# Patient Record
Sex: Male | Born: 1960 | Hispanic: Yes | Marital: Married | Smoking: Never smoker
Health system: Southern US, Community
[De-identification: ages and names within clinical notes are randomized; demographics above are authoritative.]

## PROBLEM LIST (undated history)

## (undated) DIAGNOSIS — C801 Malignant (primary) neoplasm, unspecified: Secondary | ICD-10-CM

## (undated) DIAGNOSIS — I639 Cerebral infarction, unspecified: Secondary | ICD-10-CM

## (undated) DIAGNOSIS — M1990 Unspecified osteoarthritis, unspecified site: Secondary | ICD-10-CM

## (undated) DIAGNOSIS — I10 Essential (primary) hypertension: Secondary | ICD-10-CM

## (undated) DIAGNOSIS — G459 Transient cerebral ischemic attack, unspecified: Secondary | ICD-10-CM

## (undated) DIAGNOSIS — E785 Hyperlipidemia, unspecified: Secondary | ICD-10-CM

## (undated) DIAGNOSIS — K219 Gastro-esophageal reflux disease without esophagitis: Secondary | ICD-10-CM

---

## 2004-10-02 ENCOUNTER — Ambulatory Visit: Payer: Self-pay | Admitting: Podiatry

## 2004-11-08 ENCOUNTER — Other Ambulatory Visit: Payer: Self-pay

## 2004-11-09 ENCOUNTER — Emergency Department: Payer: Self-pay | Admitting: Emergency Medicine

## 2005-04-07 ENCOUNTER — Emergency Department: Payer: Self-pay | Admitting: Emergency Medicine

## 2006-02-11 ENCOUNTER — Ambulatory Visit: Payer: Self-pay | Admitting: Vascular Surgery

## 2006-07-10 ENCOUNTER — Encounter: Payer: Self-pay | Admitting: Internal Medicine

## 2006-07-11 ENCOUNTER — Encounter: Payer: Self-pay | Admitting: Internal Medicine

## 2006-07-28 ENCOUNTER — Ambulatory Visit: Payer: Self-pay | Admitting: Internal Medicine

## 2006-08-11 ENCOUNTER — Encounter: Payer: Self-pay | Admitting: Internal Medicine

## 2006-09-10 ENCOUNTER — Encounter: Payer: Self-pay | Admitting: Internal Medicine

## 2010-02-18 ENCOUNTER — Ambulatory Visit: Payer: Self-pay | Admitting: Gastroenterology

## 2011-08-28 ENCOUNTER — Encounter: Payer: Self-pay | Admitting: Family Medicine

## 2011-09-10 ENCOUNTER — Encounter: Payer: Self-pay | Admitting: Family Medicine

## 2012-02-05 ENCOUNTER — Emergency Department: Payer: Self-pay | Admitting: Emergency Medicine

## 2012-02-05 ENCOUNTER — Ambulatory Visit: Payer: Self-pay | Admitting: Family Medicine

## 2012-02-05 LAB — COMPREHENSIVE METABOLIC PANEL
Albumin: 4.5 g/dL (ref 3.4–5.0)
Anion Gap: 9 (ref 7–16)
BUN: 15 mg/dL (ref 7–18)
Bilirubin,Total: 0.8 mg/dL (ref 0.2–1.0)
Calcium, Total: 9.5 mg/dL (ref 8.5–10.1)
Chloride: 106 mmol/L (ref 98–107)
Creatinine: 1.08 mg/dL (ref 0.60–1.30)
EGFR (African American): >60
Glucose: 121 mg/dL — ABNORMAL HIGH (ref 65–99)
Potassium: 3.9 mmol/L (ref 3.5–5.1)
SGOT(AST): 21 U/L (ref 15–37)
Sodium: 141 mmol/L (ref 136–145)
Total Protein: 8.4 g/dL — ABNORMAL HIGH (ref 6.4–8.2)

## 2012-02-05 LAB — CBC
HCT: 45.6 % (ref 40.0–52.0)
HGB: 15 g/dL (ref 13.0–18.0)
MCH: 28.5 pg (ref 26.0–34.0)
MCHC: 32.9 g/dL (ref 32.0–36.0)
Platelet: 262 10*3/uL (ref 150–440)
RDW: 13.8 % (ref 11.5–14.5)

## 2012-02-05 LAB — PROTIME-INR: INR: 1

## 2012-02-05 LAB — APTT: Activated PTT: 27.5 secs (ref 23.6–35.9)

## 2012-02-05 LAB — TROPONIN I: Troponin-I: 0.02 ng/mL

## 2012-07-08 ENCOUNTER — Encounter: Payer: Self-pay | Admitting: Family Medicine

## 2012-07-10 ENCOUNTER — Encounter: Payer: Self-pay | Admitting: Family Medicine

## 2012-12-03 LAB — BASIC METABOLIC PANEL
BUN: 18 mg/dL (ref 7–18)
Calcium, Total: 9.2 mg/dL (ref 8.5–10.1)
Chloride: 103 mmol/L (ref 98–107)
Co2: 23 mmol/L (ref 21–32)
Creatinine: 1.43 mg/dL — ABNORMAL HIGH (ref 0.60–1.30)
EGFR (African American): >60
Glucose: 132 mg/dL — ABNORMAL HIGH (ref 65–99)
Potassium: 3.6 mmol/L (ref 3.5–5.1)
Sodium: 135 mmol/L — ABNORMAL LOW (ref 136–145)

## 2012-12-03 LAB — CBC WITH DIFFERENTIAL/PLATELET
Eosinophil #: 0 10*3/uL (ref 0.0–0.7)
HCT: 41.3 % (ref 40.0–52.0)
HGB: 13.9 g/dL (ref 13.0–18.0)
Lymphocyte #: 1.9 10*3/uL (ref 1.0–3.6)
MCH: 27.9 pg (ref 26.0–34.0)
MCHC: 33.7 g/dL (ref 32.0–36.0)
MCV: 83 fL (ref 80–100)
Monocyte #: 1.9 x10 3/mm — ABNORMAL HIGH (ref 0.2–1.0)
Monocyte %: 15.6 %
Neutrophil #: 8.1 10*3/uL — ABNORMAL HIGH (ref 1.4–6.5)
Platelet: 248 10*3/uL (ref 150–440)
RBC: 4.98 10*6/uL (ref 4.40–5.90)
RDW: 13.6 % (ref 11.5–14.5)
WBC: 12 10*3/uL — ABNORMAL HIGH (ref 3.8–10.6)

## 2012-12-04 ENCOUNTER — Inpatient Hospital Stay: Payer: Self-pay | Admitting: Internal Medicine

## 2012-12-05 LAB — CBC WITH DIFFERENTIAL/PLATELET
Basophil #: 0.1 10*3/uL (ref 0.0–0.1)
Basophil %: 0.7 %
Eosinophil %: 1.4 %
HCT: 37.5 % — ABNORMAL LOW (ref 40.0–52.0)
Lymphocyte %: 18.9 %
MCH: 27.6 pg (ref 26.0–34.0)
MCHC: 33.2 g/dL (ref 32.0–36.0)
MCV: 83 fL (ref 80–100)
Monocyte #: 1.6 x10 3/mm — ABNORMAL HIGH (ref 0.2–1.0)
Neutrophil #: 5.2 10*3/uL (ref 1.4–6.5)
Neutrophil %: 60.4 %
Platelet: 201 10*3/uL (ref 150–440)
RDW: 13.3 % (ref 11.5–14.5)
WBC: 8.6 10*3/uL (ref 3.8–10.6)

## 2012-12-05 LAB — BASIC METABOLIC PANEL WITH GFR
Anion Gap: 7
BUN: 14 mg/dL
Calcium, Total: 8.8 mg/dL
Chloride: 105 mmol/L
Co2: 25 mmol/L
Creatinine: 1.13 mg/dL
EGFR (African American): >60
EGFR (Non-African Amer.): >60
Glucose: 101 mg/dL — ABNORMAL HIGH
Osmolality: 274
Potassium: 3.5 mmol/L
Sodium: 137 mmol/L

## 2012-12-05 LAB — MAGNESIUM: Magnesium: 2 mg/dL

## 2012-12-06 LAB — VANCOMYCIN, TROUGH: Vancomycin, Trough: 6 ug/mL — ABNORMAL LOW (ref 10–20)

## 2012-12-07 LAB — BASIC METABOLIC PANEL
Anion Gap: 7 (ref 7–16)
BUN: 13 mg/dL (ref 7–18)
Chloride: 104 mmol/L (ref 98–107)
Co2: 26 mmol/L (ref 21–32)
Glucose: 117 mg/dL — ABNORMAL HIGH (ref 65–99)
Osmolality: 275 (ref 275–301)
Potassium: 3.4 mmol/L — ABNORMAL LOW (ref 3.5–5.1)

## 2012-12-07 LAB — VANCOMYCIN, TROUGH: Vancomycin, Trough: 12 ug/mL (ref 10–20)

## 2012-12-09 LAB — CULTURE, BLOOD (SINGLE)

## 2014-02-13 ENCOUNTER — Ambulatory Visit: Payer: Self-pay | Admitting: Gastroenterology

## 2014-02-17 LAB — PATHOLOGY REPORT

## 2014-09-01 NOTE — H&P (Signed)
PATIENT NAME:  [name], [name] MR#:  [id number] DATE OF BIRTH:  [birth year]  DATE OF ADMISSION:  12/04/2012  PRIMARY CARE PHYSICIAN:  Dr. Sherilyn Cooter.   REFERRING PHYSICIAN:  Dr. Dahlia Client.    DENTIST:  The patient's dentist is Dr. Renee Pain.    CHIEF COMPLAINT:  Tooth pain and right facial swelling.   HISTORY OF PRESENT ILLNESS:  The patient is a 54 year old male with past medical history of coronary artery disease, stroke, hypertension and hyperlipidemia, has developed a tooth abscess on the right side regarding which he was seen by Dr. Renee Pain (dentist) and has had root canal treatment and drainage of the abscess on last Tuesday.  Eventually patient was sent home with by mouth Zithromax.  The patient's right facial swelling gradually has gotten worse and he was unable to open his mouth.  Also, the patient spiked a temp of 102.5 degrees Fahrenheit and he has called his dentist who has recommended the patient to go to the nearest ER.  CAT scan of the neck with IV contrast was done which has revealed inflammatory changes in the soft tissues lateral to the right mandible without collection.  No peritonsillar abscess was noticed.  Cultures were obtained and the patient has received IV clindamycin.  Hospitalist team is called to admit the patient.  During my examination, the patient is complaining of right-sided tooth pain and difficulty in chewing and swallowing, but the patient is able to swallow.  Denies any shortness of breath or throat closing sensation.  As reported by the ER physician, on oral examination, no fluid collection was noticed or palpable.    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   PAST SURGICAL HISTORY:  Recent root canal treatment on Tuesday by Dr. Renee Pain.   ALLERGIES:  The patient is allergic to ERYTHROMYCIN, CELEBREX AND PENICILLIN.   PSYCHOSOCIAL HISTORY:  Lives at home with wife and daughter.  Denies  smoking, alcohol or illicit drug usage.   FAMILY HISTORY:  Mother had heart problems.   HOME MEDICATIONS:  Vytorin 10/40 once daily, vitamin C 1000 mg once daily, ProAir 2 puff inhalation every six hours as needed, Nitrostat sublingual as needed, multivitamin 1 tablet once daily, metoprolol 25 mg once a day, furosemide 20 mg once daily, fish oil 1000 mg 1 capsule once a day, Plavix 75 mg once daily, allopurinol 100 mg once daily.    REVIEW OF SYSTEMS:   CONSTITUTIONAL:  Complaining of fever and weakness.  Denies any weight loss.  Complaining of tooth pain on the right side.  EYES:  Denies blurry vision, glaucoma.  EARS, NOSE, THROAT:  Denies epistaxis, discharge.  RESPIRATORY:  Denies COPD, wheezing.  CARDIOVASCULAR:  No chest pain, palpitations.  GASTROINTESTINAL:  Denies nausea, vomiting, diarrhea.  GENITOURINARY:  No dysuria, hematuria or hernia.  ENDOCRINE:  No polyuria, nocturia, thyroid problems.  HEMATOLOGIC AND LYMPHATIC:  No anemia, easy bruising, bleeding.  INTEGUMENTARY:  No acne, rash, lesions.  MUSCULOSKELETAL:  No joint pain in the neck, back.  Denies gout.  NEUROLOGIC:  Denies any history of vertigo, ataxia.  Has history of stroke.  PSYCHIATRIC:  Denies, ADD, OCD, bipolar disorder.   PHYSICAL EXAMINATION:  VITAL SIGNS:  Temperature 98.8 after Tylenol, pulse 64, respirations 18, blood pressure 113/75, pulse ox 97%.  GENERAL APPEARANCE:  Not under acute distress.  Moderately built and moderately nourished.  HEENT:  Normocephalic.  Pupils are equally reacting to light and accommodation.  Atraumatic.  Right-sided maxillary swelling with tenderness is present.  The patient is having difficulty in opening his mouth, but no swollen tongue.  Trachea is midline.  NECK:  Supple.  No JVD.  Range of motion is limited in view of right maxillary tenderness and no discharge noticed.  LUNGS:  Clear to auscultation bilaterally.  No accessory muscle usage.  No anterior chest wall tenderness on  palpation.  CARDIAC:  S1, S2 normal.  Regular rate and rhythm.  No murmurs.  GASTROINTESTINAL:  Soft.  Bowel sounds are positive in all four quadrants.  Nontender, nondistended.  No masses felt.  No hepatosplenomegaly.  NEUROLOGIC:  Awake, alert, oriented x 3.  Motor and sensory are grossly intact.  Reflexes are 2+.  EXTREMITIES:  No edema.  No cyanosis.  No clubbing.  SKIN:  Warm to touch.  Normal turgor.  No rashes.  MUSCULOSKELETAL:  No joint effusion, tenderness or erythema.   LABORATORY AND IMAGING STUDIES:  Glucose 132, BUN 18, creatinine 1.43, sodium 135, potassium 3.6, chloride 103, CO2 23, anion gap is 9, GFR 60, serum osmolality 274, calcium 9.2.  WBC 12.4, hemoglobin 13.9, hematocrit 41.3, platelets 248.  CT of the neck with intravenous contrast has revealed no peritonsillar abscess.  No significant tonsillar enlargement.  Inflammatory changes in the soft tissues lateral to the right mandible without collection.  Remainder of the findings which revealed no lymphadenopathy.  Hypopharynx and larynx are unremarkable.  Normal epiglottis.   ASSESSMENT AND PLAN:  A 54 year old male presenting to the ER with a chief complaint of right facial swelling after he had a root canal treatment for draining of tooth abscess on Tuesday, will be admitted with the following assessment and plan.  1.  Sepsis from right tooth infection with right mandibular soft tissue swelling.  Blood cultures were ordered.  The patient will be given IV clindamycin and vancomycin.  The patient will be started on probiotic as well.  2.  We will provide him puree diet as he is having difficulty in swallowing.  If the patient could tolerate diet, we will resume all his home medications.  3.  History of stroke and coronary artery disease.  Resume his home medications Plavix and Xarelto.  4.  Hypertension.  Blood pressure is stable at this time.  5.  Acute renal insufficiency.  We will provide him gentle hydration with IV fluids and  check a.m. labs.  6.  We will provide him GI prophylaxis.  7.  DVT prophylaxis is not needed as the patient is on Xarelto.  8.  He is FULL CODE.  Wife is medical power of attorney.   Total time spent on the admission is 45 minutes.    The diagnosis and plan of care was discussed with him.  He is aware of the plan.    ____________________________ Nicholes Mango, MD ag:ea D: 12/04/2012 03:03:08 ET T: 12/04/2012 03:33:54 ET JOB#: [id number]  cc: Nicholes Mango, MD, <Dictator> Nicholes Mango MD ELECTRONICALLY SIGNED 12/06/2012 7:21

## 2014-09-01 NOTE — Discharge Summary (Signed)
PATIENT NAME:  [name], [name] MR#:  [id number] DATE OF BIRTH:  1961-02-27  DATE OF ADMISSION:  12/04/2012 DATE OF DISCHARGE:  12/08/2012  ADMITTING PHYSICIAN: Dr. Margaretmary Eddy.    DISCHARGING PHYSICIAN: Gladstone Lighter, M.D.   CONSULTATIONS IN THE HOSPITAL: None.   PRIMARY CARE PHYSICIAN: Nonlocal.   DISCHARGE DIAGNOSES: 1.  Sepsis.  2.  Right perimandibular cellulitis post root canal treatment.  3.  Acute renal failure on admission, which resolved.  4.  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  5.  History of deep vein thrombosis.  6.  Coronary artery disease.  7.  History of cerebrovascular accident.    DISCHARGE HOME MEDICATIONS:  1.  Magnesium 1 tablet p.o. daily.  2.  Ranitidine 150 mg p.o. b.i.d.  3.  Atorvastatin 80 mg p.o. daily.  4.  Xarelto 20 mg p.o. daily.  5.  Colchicine 0.6 mg p.o. b.i.d. for 3 more days and then take p.o. daily.  6.  Clindamycin 300 mg p.o. q. 8 hours for 5 more days.  7.  Prednisone taper, starting at 40 mg, taper off by 10 mg daily.   DISCHARGE DIET: Low-sodium diet.   DISCHARGE ACTIVITY: As tolerated.    FOLLOWUP INSTRUCTIONS:  1.  PCP follow-up in two weeks.  2.  Follow up with dentist in 1 week.  LABS AND IMAGING STUDIES PRIOR TO DISCHARGE: Sodium 137, potassium 3.4, chloride 104, bicarbonate 26, BUN 13, creatinine 1.1, glucose 117, calcium of 8.9, uric acid 6.6. WBC 11.7 after steroid shot was given.  CT of the maxillofacial area showing inflammatory changes in right maxillary, mandibular region without identifiable loculated fluid collection or  drainable abscess. There is significant stranding in the subcutaneous fatty tissues and muscular thickening secondary to edema, infectious or inflammatory process. His hemoglobin is 12.4, hematocrit 37.5,  platelet count is 201. Blood cultures remained negative while in the hospital. CT of the neck with contrast on admission showing inflammatory changes within the soft tissues  lateral to the mandible on the right side without evidence of fluid collection.   BRIEF HOSPITAL COURSE: [name] is a 54 year old male with past medical history significant for history of coronary artery disease, history of CVA, hypertension, lipidemia, presented to the hospital secondary to high fever, elevated white count with right facial swelling and also tooth pain.   1.  Sepsis secondary to right perimandibular cellulitis. The patient had a root canal treatment  done by his dentist and drainage of the abscess a week prior to presentation. Post procedure the patient has developed fevers and significant swelling and presented to the ER.  A CT of the neck did not show any abscess and blood cultures were negative. The patient was started on vancomycin and Zosyn and was changed over to clindamycin. The patient has significant swelling of his jaw for several days so repeat CT of the maxillofacial area was done. It did not reveal any abscess. The patient does not have any breakdown or open ulcers  when seen from inside of the mouth. It was all soft tissue swelling lateral to his face.  It has significantly improved and now the patient is able to move his jaw and eat regular food. Initially on admission it seems he was only on a liquid diet because of that.  The patient will follow up with his dentist and will continue to take his antibiotic at this time. The patient has remained fever free and the white count has improved significantly  prior to discharge.   2.  Acute renal failure, likely secondary to sepsis and ATN on admission. Improved with IV fluid and  it resolved at the time of discharge.    3.  Acute gouty arthritis of the elbow and also the hand on the right side. The patient has had significant history of gouty attack involving multiple joints and was started on colchicine in the hospital and small prednisone taper at the time of discharge.   4.  History of DVT and CVA.  The patient is on  Xarelto as an outpatient, which were continued. He is also on a statin.     The patient's course has been otherwise uneventful in the hospital.   DISCHARGE CONDITION: Stable.   DISCHARGE DISPOSITION: Home.   TIME SPENT ON DISCHARGE: 40 minutes.   ____________________________ Gladstone Lighter, MD rk:dp D: 12/08/2012 14:04:10 ET T: 12/08/2012 15:11:40 ET JOB#: [id number]  cc: Gladstone Lighter, MD, <Dictator> Gladstone Lighter MD ELECTRONICALLY SIGNED 12/13/2012 15:03

## 2015-02-06 ENCOUNTER — Other Ambulatory Visit
Admission: RE | Admit: 2015-02-06 | Discharge: 2015-02-06 | Disposition: A | Discharge status: Home / Self Care | Payer: Self-pay | Source: Other Acute Inpatient Hospital | Attending: Rheumatology | Admitting: Rheumatology

## 2015-02-06 DIAGNOSIS — M25561 Pain in right knee: Secondary | ICD-10-CM | POA: Insufficient documentation

## 2015-02-06 LAB — SYNOVIAL CELL COUNT + DIFF, W/ CRYSTALS
EOSINOPHILS-SYNOVIAL: 0 %
LYMPHOCYTES-SYNOVIAL FLD: 72 %
Monocyte-Macrophage-Synovial Fluid: 27 %
Neutrophil, Synovial: 1 %
Other Cells-SYN: 0
WBC, Synovial: 149 /mm3 (ref 0–200)

## 2015-04-30 IMAGING — CT CT MAXILLOFACIAL WITH CONTRAST
1 series · 15 of 30 positions shown, 19 images · non-contrast
Comparison: none

REASON FOR EXAM: root canal and subsequent infxn. eval for evolving
abcess as still with sig swel
COMMENTS:

[Series 4: soft tissue · axial · 0.34mm/px · z∈[-237,-60]mm · 15 of 65 slices shown, 19 images]
[im 3/65  brain]
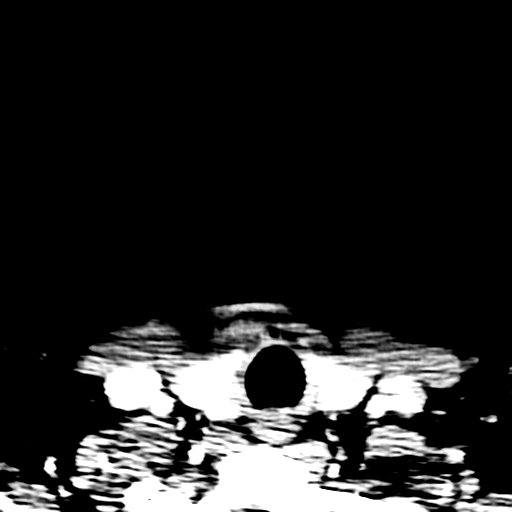
[im 3/65  bone]
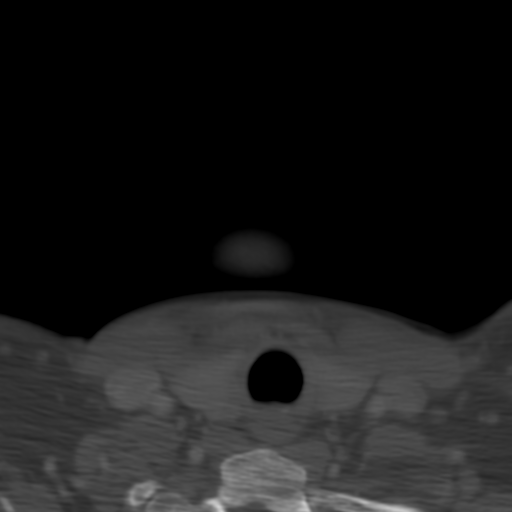
[im 7/65  bone]
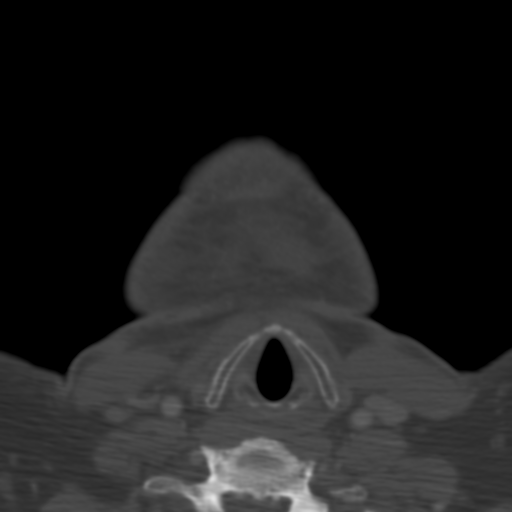
[im 12/65  bone]
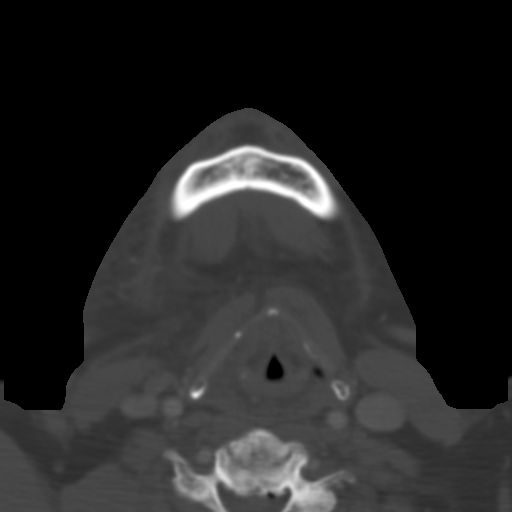
[im 16/65  bone]
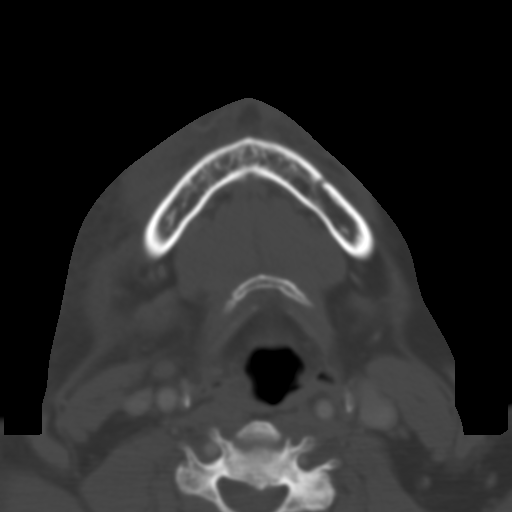
[im 20/65  brain]
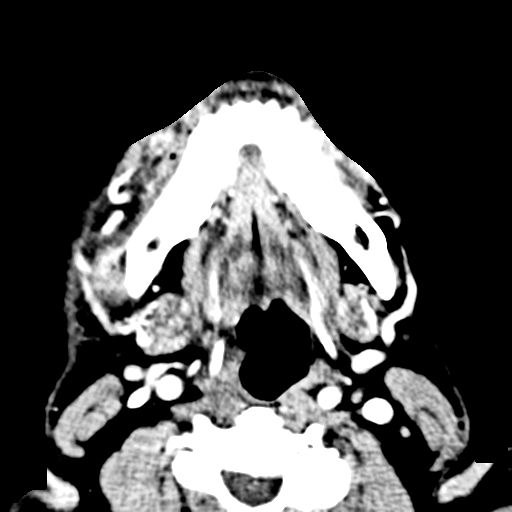
[im 20/65  bone]
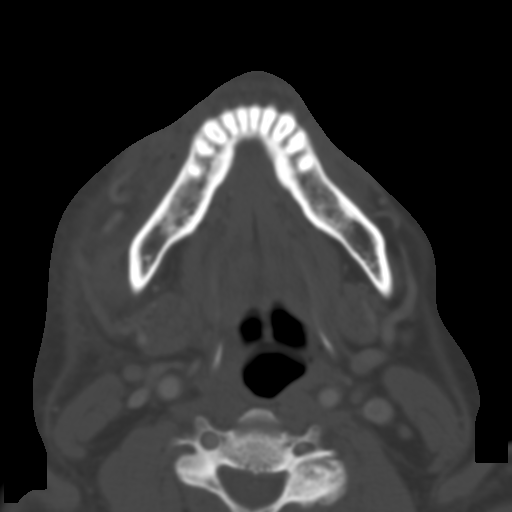
[im 25/65  bone]
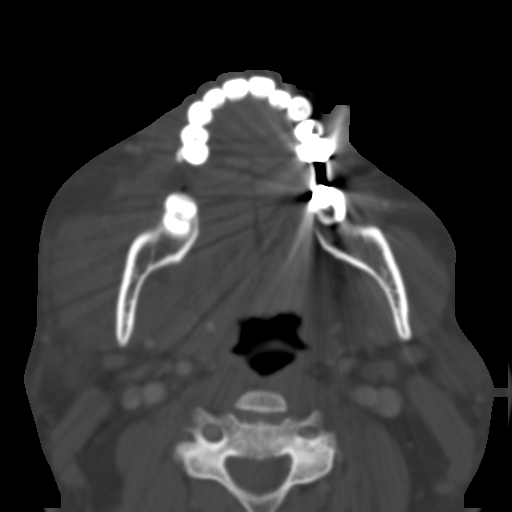
[im 29/65  bone]
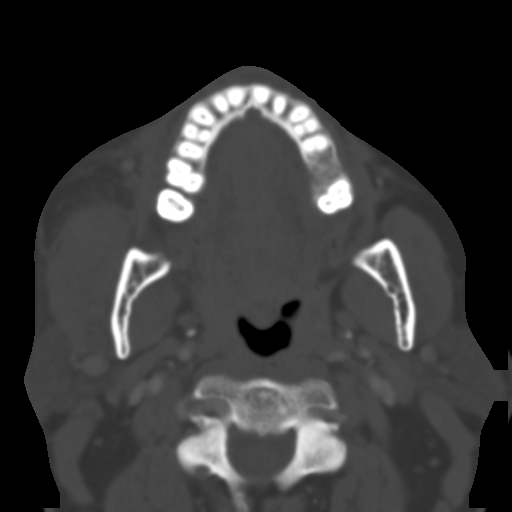
[im 34/65  bone]
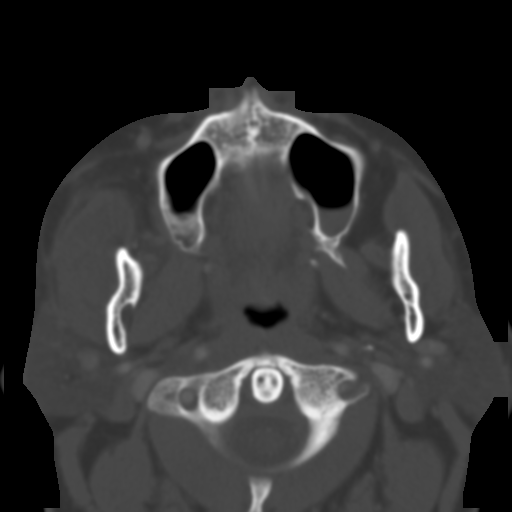
[im 36/65  brain]
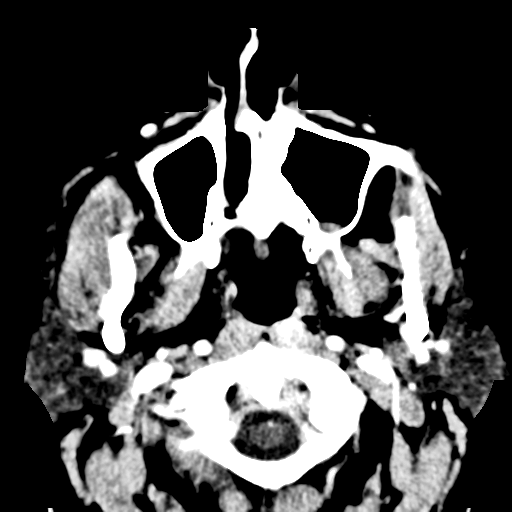
[im 36/65  bone]
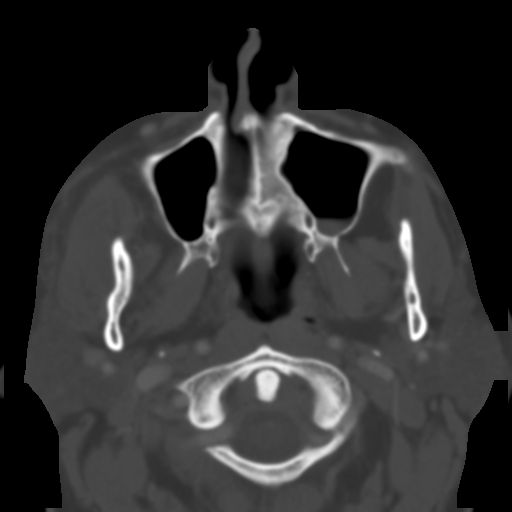
[im 40/65  bone]
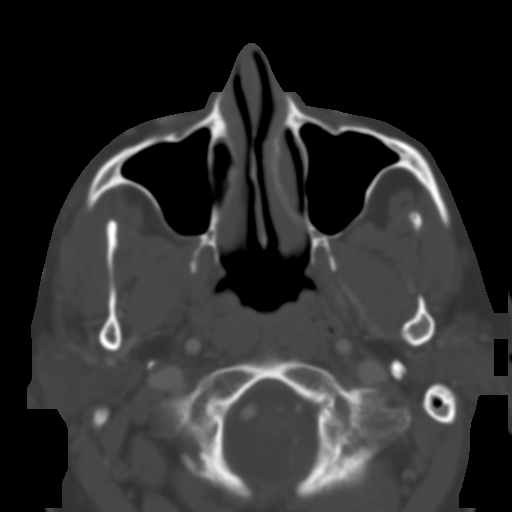
[im 45/65  bone]
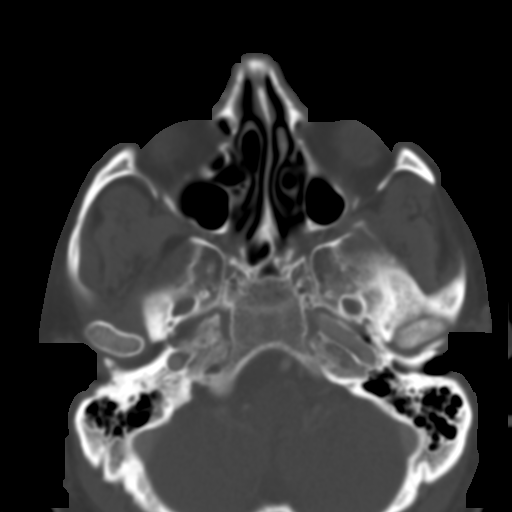
[im 49/65  bone]
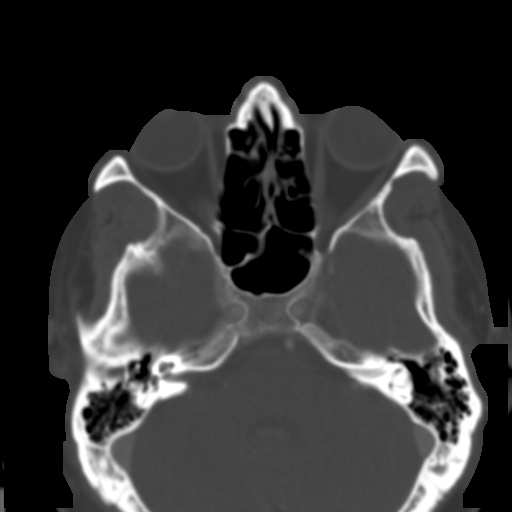
[im 53/65  brain]
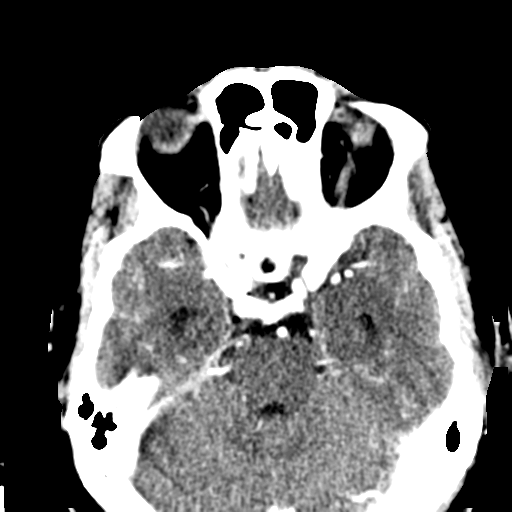
[im 53/65  bone]
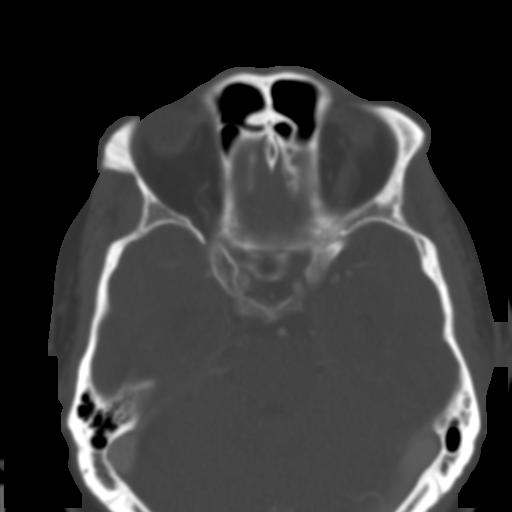
[im 58/65  bone]
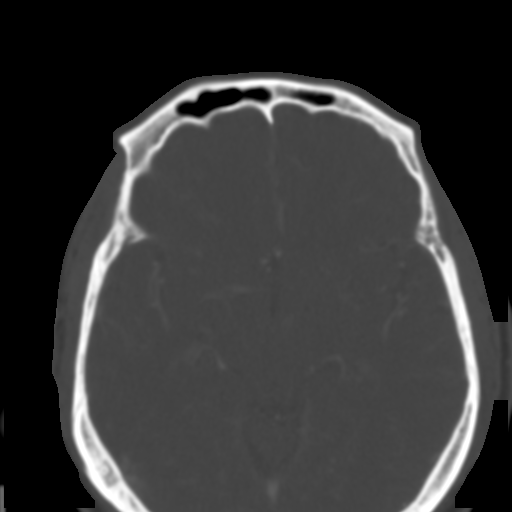
[im 62/65  bone]
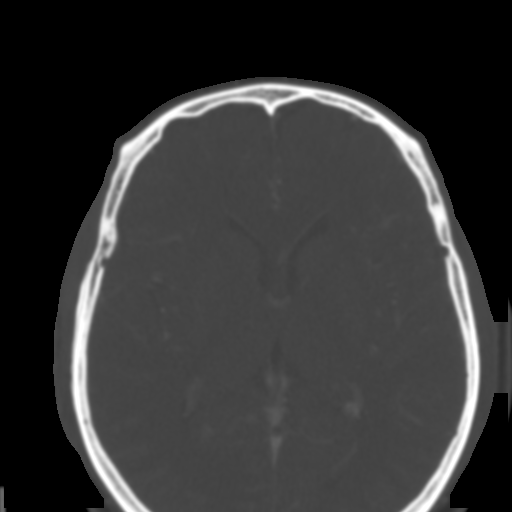

[15 of 30 positions shown; findings below may reference images not displayed]

PROCEDURE:     CT  - CT MAXILLOFACIAL AREA W  - December 06, 2012  [DATE]

RESULT:     CT of the maxillofacial region is performed with 75 mL of
1sovue-TBC iodinated intravenous contrast with images reconstructed at soft
tissue window settings at 3 mm slice thickness in axial and coronal planes
with bone window levels also reconstructed in the same axial and coronal
plane format. Study demonstrates a small air-fluid level density in the
floor of the left maxillary sinus posteriorly. There is significant soft
tissue swelling and inflammatory stranding about the right maxillary and
right mandibular region without a definable or drainable fluid collection.
There is artifact from the patient's dental work 3 some reactive prominent
lymph nodes are seen without pathologic adenopathy appreciated. Salivary
glands appear to be unremarkable bilaterally. There some mild thickening
along the platysma on the right and along the masseter muscle on the right
which could be reactive edematous, infectious or inflammatory changes. The
included portions of the thyroid gland appear to enhance normally. The
nasopharynx and oropharynx appear unremarkable. The orbits and remaining
sinuses appear to be unremarkable. Included portion of the brain enhances
normally.
IMPRESSION: Significant inflammatory changes as described dominantly in
the right maxillary and mandibular region without a identifiable loculated
fluid collection or drainable abscess. This does not mean that continued or
progressive infectious process is absent. There is significant stranding in
the subcutaneous fatty tissues and some muscular thickening as described.
This could be secondary to edema, infectious or inflammatory process. The
incidental note is made of a focal hyperdensity in the lytic regions on
image 19 and 23 which could represent foreign bodies or soft tissue
calcifications. Correlate with history.

[REDACTED]

## 2018-11-09 ENCOUNTER — Other Ambulatory Visit: Payer: Self-pay

## 2018-11-09 ENCOUNTER — Encounter: Payer: Managed Care, Other (non HMO) | Attending: Physician Assistant | Admitting: Physician Assistant

## 2018-11-09 DIAGNOSIS — Z86718 Personal history of other venous thrombosis and embolism: Secondary | ICD-10-CM | POA: Diagnosis not present

## 2018-11-09 DIAGNOSIS — I10 Essential (primary) hypertension: Secondary | ICD-10-CM | POA: Diagnosis not present

## 2018-11-09 DIAGNOSIS — I890 Lymphedema, not elsewhere classified: Secondary | ICD-10-CM | POA: Insufficient documentation

## 2018-11-09 DIAGNOSIS — M069 Rheumatoid arthritis, unspecified: Secondary | ICD-10-CM | POA: Diagnosis not present

## 2018-11-09 DIAGNOSIS — I252 Old myocardial infarction: Secondary | ICD-10-CM | POA: Insufficient documentation

## 2018-11-09 DIAGNOSIS — I2510 Atherosclerotic heart disease of native coronary artery without angina pectoris: Secondary | ICD-10-CM | POA: Diagnosis not present

## 2018-11-09 NOTE — Progress Notes (Signed)
[place], [place] ([id number]) Visit Report for 11/09/2018 Abuse/Suicide Risk Screen Details Patient Name: [name], [name] Date of Service: 11/09/2018 9:45 AM Medical Record Number: [id number] Patient Account Number: [id number] Date of Birth/Sex: 09/13/1960 (57 y.o. M) Treating RN: Army Melia Primary Care Zubin Pontillo: Johny Drilling Other Clinician: Referring Betul Brisky: Johny Drilling Treating Lua Feng/Extender: Melburn Hake, HOYT Weeks in Treatment: 0 Abuse/Suicide Risk Screen Items Answer ABUSE RISK SCREEN: Has anyone close to you tried to hurt or harm you recentlyo No Do you feel uncomfortable with anyone in your familyo No Has anyone forced you do things that you didnot want to doo No Electronic Signature(s) Signed: 11/09/2018 2:58:53 PM By: Army Melia Entered By: Army Melia on 11/09/2018 09:47:59 [place], [place] ([id number]) -------------------------------------------------------------------------------- Activities of Daily Living Details Patient Name: [name] Date of Service: 11/09/2018 9:45 AM Medical Record Number: [id number] Patient Account Number: [id number] Date of Birth/Sex: [birth year] (57 y.o. M) Treating RN: Army Melia Primary Care Selen Smucker: Johny Drilling Other Clinician: Referring Yasuko Lapage: Johny Drilling Treating Jana Swartzlander/Extender: Melburn Hake, HOYT Weeks in Treatment: 0 Activities of Daily Living Items Answer Activities of Daily Living (Please select one for each item) Drive Automobile Completely Able Take Medications Completely Able Use Telephone Completely Able Care for Appearance Completely Able Use Toilet Completely Able Bath / Shower Completely Able Dress Self Completely Able Feed Self Completely Able Walk Completely Able Get In / Out Bed Completely Able Housework Completely Able Prepare Meals Completely Able Handle Money Completely Able Shop for Self Completely Able Electronic Signature(s) Signed: 11/09/2018 2:58:53 PM By: Army Melia Entered By: Army Melia on  11/09/2018 09:48:11 Torrez, [place] ([id number]) -------------------------------------------------------------------------------- Education Screening Details Patient Name: [name] Date of Service: 11/09/2018 9:45 AM Medical Record Number: [id number] Patient Account Number: [id number] Date of Birth/Sex: Aug 30, 1960 (57 y.o. M) Treating RN: Army Melia Primary Care Finnlee Guarnieri: Johny Drilling Other Clinician: Referring Jenne Sellinger: Johny Drilling Treating Shoni Quijas/Extender: Melburn Hake, HOYT Weeks in Treatment: 0 Primary Learner Assessed: Patient Learning Preferences/Education Level/Primary Language Learning Preference: Explanation, Demonstration Highest Education Level: College or Above Preferred Language: English Cognitive Barrier Language Barrier: No Translator Needed: No Memory Deficit: No Emotional Barrier: No Cultural/Religious Beliefs Affecting Medical Care: No Physical Barrier Impaired Vision: No Impaired Hearing: No Decreased Hand dexterity: No Knowledge/Comprehension Knowledge Level: High Comprehension Level: High Ability to understand written High instructions: Ability to understand verbal High instructions: Motivation Anxiety Level: Calm Cooperation: Cooperative Education Importance: Acknowledges Need Interest in Health Problems: Asks Questions Perception: Coherent Willingness to Engage in Self- High Management Activities: Readiness to Engage in Self- High Management Activities: Electronic Signature(s) Signed: 11/09/2018 2:58:53 PM By: Army Melia Entered By: Army Melia on 11/09/2018 09:49:14 [place], [place] ([id number]) -------------------------------------------------------------------------------- Fall Risk Assessment Details Patient Name: [name] Date of Service: 11/09/2018 9:45 AM Medical Record Number: [id number] Patient Account Number: [id number] Date of Birth/Sex: 1960-05-25 (58 y.o. M) Treating RN: Army Melia Primary Care Jalisia Puchalski: Johny Drilling  Other Clinician: Referring Sathvika Ojo: Johny Drilling Treating Jia Dottavio/Extender: Melburn Hake, HOYT Weeks in Treatment: 0 Fall Risk Assessment Items Have you had 2 or more falls in the last 12 monthso 0 No Have you had any fall that resulted in injury in the last 12 monthso 0 No FALLS RISK SCREEN History of falling - immediate or within 3 months 0 No Secondary diagnosis (Do you have 2 or more medical diagnoseso) 0 No Ambulatory aid None/bed rest/wheelchair/nurse 0 No Crutches/cane/walker 0 No Furniture 0 No Intravenous therapy Access/Saline/Heparin Lock 0 No Gait/Transferring Normal/ bed rest/ wheelchair 0 No Weak (short steps with or without shuffle, stooped  but able to lift head while 0 No walking, may seek support from furniture) Impaired (short steps with shuffle, may have difficulty arising from chair, head 0 No down, impaired balance) Mental Status Oriented to own ability 0 No Electronic Signature(s) Signed: 11/09/2018 2:58:53 PM By: Army Melia Entered By: Army Melia on 11/09/2018 09:49:23 [place], [place] ([id number]) -------------------------------------------------------------------------------- Foot Assessment Details Patient Name: [name] Date of Service: 11/09/2018 9:45 AM Medical Record Number: [id number] Patient Account Number: [id number] Date of Birth/Sex: [birth year] (58 y.o. M) Treating RN: Army Melia Primary Care Teresea Donley: Johny Drilling Other Clinician: Referring Dorianna Mckiver: Johny Drilling Treating Vannah Nadal/Extender: Melburn Hake, HOYT Weeks in Treatment: 0 Foot Assessment Items Site Locations + = Sensation present, - = Sensation absent, C = Callus, U = Ulcer R = Redness, W = Warmth, M = Maceration, PU = Pre-ulcerative lesion F = Fissure, S = Swelling, D = Dryness Assessment Right: Left: Other Deformity: No No Prior Foot Ulcer: No No Prior Amputation: No No Charcot Joint: No No Ambulatory Status: Ambulatory Without Help Gait: Steady Electronic  Signature(s) Signed: 11/09/2018 2:58:53 PM By: Army Melia Entered By: Army Melia on 11/09/2018 09:49:45 [place]man, [place] ([id number]) -------------------------------------------------------------------------------- Nutrition Risk Screening Details Patient Name: [name] Date of Service: 11/09/2018 9:45 AM Medical Record Number: [id number] Patient Account Number: [id number] Date of Birth/Sex: 1960/08/16 (57 y.o. M) Treating RN: Army Melia Primary Care Shany Marinez: Johny Drilling Other Clinician: Referring Cyncere Ruhe: Johny Drilling Treating Ryle Buscemi/Extender: Melburn Hake, HOYT Weeks in Treatment: 0 Height (in): 71 Weight (lbs): 217 Body Mass Index (BMI): 30.3 Nutrition Risk Screening Items Score Screening NUTRITION RISK SCREEN: I have an illness or condition that made me change the kind and/or amount of 0 No food I eat I eat fewer than two meals per day 0 No I eat few fruits and vegetables, or milk products 0 No I have three or more drinks of beer, liquor or wine almost every day 0 No I have tooth or mouth problems that make it hard for me to eat 0 No I don't always have enough money to buy the food I need 0 No I eat alone most of the time 0 No I take three or more different prescribed or over-the-counter drugs a day 0 No Without wanting to, I have lost or gained 10 pounds in the last six months 0 No I am not always physically able to shop, cook and/or feed myself 0 No Nutrition Protocols Good Risk Protocol 0 No interventions needed Moderate Risk Protocol High Risk Proctocol Risk Level: Good Risk Score: 0 Electronic Signature(s) Signed: 11/09/2018 2:58:53 PM By: Army Melia Entered By: Army Melia on 11/09/2018 09:49:36

## 2018-11-10 NOTE — Progress Notes (Signed)
[place], [place] ([id number]) Visit Report for 11/09/2018 Allergy List Details Patient Name: [name], [name] Date of Service: 11/09/2018 9:45 AM Medical Record Number: [id number] Patient Account Number: [id number] Date of Birth/Sex: [birth year] (57 y.o. M) Treating RN: Army Melia Primary Care Makiyah Zentz: Johny Drilling Other Clinician: Referring Genna Casimir: Johny Drilling Treating Ruqaya Strauss/Extender: Melburn Hake, HOYT Weeks in Treatment: 0 Allergies Active Allergies No known allergies Allergy Notes Electronic Signature(s) Signed: 11/09/2018 2:58:53 PM By: Army Melia Entered By: Army Melia on 11/09/2018 09:41:48 Minerd, [place] ([id number]) -------------------------------------------------------------------------------- Arrival Information Details Patient Name: [name] Date of Service: 11/09/2018 9:45 AM Medical Record Number: [id number] Patient Account Number: [id number] Date of Birth/Sex: 11-05-1960 (57 y.o. M) Treating RN: Army Melia Primary Care Gearlene Godsil: Johny Drilling Other Clinician: Referring Fiora Weill: Johny Drilling Treating Ahnaf Caponi/Extender: Melburn Hake, HOYT Weeks in Treatment: 0 Visit Information Patient Arrived: Ambulatory Arrival Time: 09:34 Accompanied By: self Transfer Assistance: None Patient Identification Verified: Yes Secondary Verification Process Yes Completed: Patient Has Alerts: Yes Patient Alerts: Patient on Blood Thinner Xarelto Electronic Signature(s) Signed: 11/09/2018 4:41:12 PM By: Montey Hora Entered By: Montey Hora on 11/09/2018 10:13:57 Shen, [place] ([id number]) -------------------------------------------------------------------------------- Clinic Level of Care Assessment Details Patient Name: [name] Date of Service: 11/09/2018 9:45 AM Medical Record Number: [id number] Patient Account Number: [id number] Date of Birth/Sex: [birth year] (57 y.o. M) Treating RN: Montey Hora Primary Care Mare Ludtke: Johny Drilling Other Clinician: Referring Linkon Siverson:  Johny Drilling Treating Shanekqua Schaper/Extender: Melburn Hake, HOYT Weeks in Treatment: 0 Clinic Level of Care Assessment Items TOOL 1 Quantity Score []  - Use when EandM and Procedure is performed on INITIAL visit 0 ASSESSMENTS - Nursing Assessment / Reassessment X - General Physical Exam (combine w/ comprehensive assessment (listed just below) when 1 20 performed on new pt. evals) X- 1 25 Comprehensive Assessment (HX, ROS, Risk Assessments, Wounds Hx, etc.) ASSESSMENTS - Wound and Skin Assessment / Reassessment X - Dermatologic / Skin Assessment (not related to wound area) 1 10 ASSESSMENTS - Ostomy and/or Continence Assessment and Care []  - Incontinence Assessment and Management 0 []  - 0 Ostomy Care Assessment and Management (repouching, etc.) PROCESS - Coordination of Care X - Simple Patient / Family Education for ongoing care 1 15 []  - 0 Complex (extensive) Patient / Family Education for ongoing care X- 1 10 Staff obtains Programmer, systems, Records, Test Results / Process Orders []  - 0 Staff telephones HHA, Nursing Homes / Clarify orders / etc []  - 0 Routine Transfer to another Facility (non-emergent condition) []  - 0 Routine Hospital Admission (non-emergent condition) X- 1 15 New Admissions / Biomedical engineer / Ordering NPWT, Apligraf, etc. []  - 0 Emergency Hospital Admission (emergent condition) PROCESS - Special Needs []  - Pediatric / Minor Patient Management 0 []  - 0 Isolation Patient Management []  - 0 Hearing / Language / Visual special needs []  - 0 Assessment of Community assistance (transportation, D/C planning, etc.) []  - 0 Additional assistance / Altered mentation []  - 0 Support Surface(s) Assessment (bed, cushion, seat, etc.) Royer, Kilo ([id number]) INTERVENTIONS - Miscellaneous []  - External ear exam 0 []  - 0 Patient Transfer (multiple staff / Civil Service fast streamer / Similar devices) []  - 0 Simple Staple / Suture removal (25 or less) []  - 0 Complex Staple / Suture  removal (26 or more) []  - 0 Hypo/Hyperglycemic Management (do not check if billed separately) X- 1 15 Ankle / Brachial Index (ABI) - do not check if billed separately Has the patient been seen at the hospital within the last three years: Yes Total Score: 110 Level Of Care:  New/Established - Level 3 Electronic Signature(s) Signed: 11/09/2018 4:41:12 PM By: Montey Hora Entered By: Montey Hora on 11/09/2018 10:18:59 Pott, Adreyan ([id number]) -------------------------------------------------------------------------------- Compression Therapy Details Patient Name: [name] Date of Service: 11/09/2018 9:45 AM Medical Record Number: [id number] Patient Account Number: [id number] Date of Birth/Sex: 07-Sep-1960 (58 y.o. M) Treating RN: Montey Hora Primary Care Kalaysia Demonbreun: Johny Drilling Other Clinician: Referring Antavia Tandy: Johny Drilling Treating Siddhanth Denk/Extender: Melburn Hake, HOYT Weeks in Treatment: 0 Compression Therapy Performed for Wound Assessment: NonWound Condition Lymphedema - Bilateral Leg Performed By: Clinician Montey Hora, RN Compression Type: Three Layer Pre Treatment ABI: 1.3 Post Procedure Diagnosis Same as Pre-procedure Electronic Signature(s) Signed: 11/09/2018 4:41:12 PM By: Montey Hora Entered By: Montey Hora on 11/09/2018 10:18:38 Tasker, Aariv ([id number]) -------------------------------------------------------------------------------- Encounter Discharge Information Details Patient Name: [name] Date of Service: 11/09/2018 9:45 AM Medical Record Number: [id number] Patient Account Number: [id number] Date of Birth/Sex: August 19, 1960 (58 y.o. M) Treating RN: Montey Hora Primary Care Tyhesha Dutson: Johny Drilling Other Clinician: Referring Meleni Delahunt: Johny Drilling Treating Karolina Zamor/Extender: Melburn Hake, HOYT Weeks in Treatment: 0 Encounter Discharge Information Items Discharge Condition: Stable Ambulatory Status: Ambulatory Discharge Destination:  Home Transportation: Private Auto Accompanied By: self Schedule Follow-up Appointment: Yes Clinical Summary of Care: Electronic Signature(s) Signed: 11/09/2018 4:41:12 PM By: Montey Hora Entered By: Montey Hora on 11/09/2018 10:21:42 Stal[place], [place] ([id number]) -------------------------------------------------------------------------------- Lower Extremity Assessment Details Patient Name: [name] Date of Service: 11/09/2018 9:45 AM Medical Record Number: [id number] Patient Account Number: [id number] Date of Birth/Sex: 07-23-1960 (57 y.o. M) Treating RN: Army Melia Primary Care Alaira Level: Johny Drilling Other Clinician: Referring Birtha Hatler: Johny Drilling Treating Zaahir Pickney/Extender: Melburn Hake, HOYT Weeks in Treatment: 0 Edema Assessment Assessed: [Left: No] [Right: No] Edema: [Left: Yes] [Right: Yes] Calf Left: Right: Point of Measurement: 36 cm From Medial Instep 38 cm 39 cm Ankle Left: Right: Point of Measurement: 12 cm From Medial Instep 23 cm 24 cm Vascular Assessment Pulses: Dorsalis Pedis Palpable: [Left:Yes] [Right:Yes] Blood Pressure: Brachial: [Left:130] [Right:130] Dorsalis Pedis: 170 [Left:Dorsalis Pedis: 170] Ankle: Posterior Tibial: 160 [Left:Posterior Tibial: 190 1.31] [Right:1.46] Electronic Signature(s) Signed: 11/09/2018 2:58:53 PM By: Army Melia Entered By: Army Melia on 11/09/2018 09:59:58 Dwyer, Maynor ([id number]) -------------------------------------------------------------------------------- Multi Wound Chart Details Patient Name: [name] Date of Service: 11/09/2018 9:45 AM Medical Record Number: [id number] Patient Account Number: [id number] Date of Birth/Sex: 1961/03/07 (57 y.o. M) Treating RN: Montey Hora Primary Care Rathana Viveros: Johny Drilling Other Clinician: Referring Christinia Lambeth: Johny Drilling Treating Victorious Cosio/Extender: Melburn Hake, HOYT Weeks in Treatment: 0 Vital Signs Height(in): 71 Pulse(bpm): 68 Weight(lbs): 217 Blood  Pressure(mmHg): 133/94 Body Mass Index(BMI): 30 Temperature(F): 98.5 Respiratory Rate 16 (breaths/min): Wound Assessments Treatment Notes Electronic Signature(s) Signed: 11/09/2018 4:41:12 PM By: Montey Hora Entered By: Montey Hora on 11/09/2018 10:16:28 Chiem, Coco ([id number]) -------------------------------------------------------------------------------- Multi-Disciplinary Care Plan Details Patient Name: [name] Date of Service: 11/09/2018 9:45 AM Medical Record Number: [id number] Patient Account Number: [id number] Date of Birth/Sex: Jul 02, 1960 (58 y.o. M) Treating RN: Montey Hora Primary Care Alexandre Lightsey: Johny Drilling Other Clinician: Referring Lauralyn Shadowens: Johny Drilling Treating Dalvin Clipper/Extender: Melburn Hake, HOYT Weeks in Treatment: 0 Active Inactive Orientation to the Wound Care Program Nursing Diagnoses: Knowledge deficit related to the wound healing center program Goals: Patient/caregiver will verbalize understanding of the [facility] Program Date Initiated: 11/09/2018 Target Resolution Date: 01/15/2019 Goal Status: Active Interventions: Provide education on orientation to the wound center Notes: Venous Leg Ulcer Nursing Diagnoses: Potential for venous Insuffiency (use before diagnosis confirmed) Goals: Patient will maintain optimal edema control Date Initiated: 11/09/2018 Target Resolution Date:  01/15/2019 Goal Status: Active Interventions: Compression as ordered Notes: Electronic Signature(s) Signed: 11/09/2018 4:41:12 PM By: Montey Hora Entered By: Montey Hora on 11/09/2018 10:16:14 Husain, Ysmael ([id number]) -------------------------------------------------------------------------------- Non-Wound Condition Assessment Details Patient Name: [name] Date of Service: 11/09/2018 9:45 AM Medical Record Number: [id number] Patient Account Number: [id number] Date of Birth/Sex: 02/17/1961 (57 y.o. M) Treating RN: Army Melia Primary Care  Chancellor Vanderloop: Johny Drilling Other Clinician: Referring Daejon Lich: Johny Drilling Treating Keefer Soulliere/Extender: Melburn Hake, HOYT Weeks in Treatment: 0 Non-Wound Condition: Condition: Lymphedema Location: Leg Side: Bilateral Photos Electronic Signature(s) Signed: 11/09/2018 10:04:42 AM By: Army Melia Entered By: Army Melia on 11/09/2018 10:04:42 Tallie, [place] ([id number]) -------------------------------------------------------------------------------- Pain Assessment Details Patient Name: [name] Date of Service: 11/09/2018 9:45 AM Medical Record Number: [id number] Patient Account Number: [id number] Date of Birth/Sex: December 20, 1960 (57 y.o. M) Treating RN: Army Melia Primary Care Fe Okubo: Johny Drilling Other Clinician: Referring Roxie Kreeger: Johny Drilling Treating Anel Purohit/Extender: Melburn Hake, HOYT Weeks in Treatment: 0 Active Problems Location of Pain Severity and Description of Pain Patient Has Paino Yes Site Locations Pain Location: Generalized Pain, Pain in Ulcers Rate the pain. Current Pain Level: 7 Pain Management and Medication Current Pain Management: Electronic Signature(s) Signed: 11/09/2018 2:58:53 PM By: Army Melia Entered By: Army Melia on 11/09/2018 09:35:17 Hinger, [place] ([id number]) -------------------------------------------------------------------------------- Patient/Caregiver Education Details Patient Name: [name] Date of Service: 11/09/2018 9:45 AM Medical Record Number: [id number] Patient Account Number: [id number] Date of Birth/Gender: 28-Apr-1961 (57 y.o. M) Treating RN: Montey Hora Primary Care Physician: Johny Drilling Other Clinician: Referring Physician: Johny Drilling Treating Physician/Extender: Melburn Hake, HOYT Weeks in Treatment: 0 Education Assessment Education Provided To: Patient Education Topics Provided Venous: Handouts: Other: need for ongoing compression Methods: Explain/Verbal Responses: State content correctly Electronic  Signature(s) Signed: 11/09/2018 4:41:12 PM By: Montey Hora Entered By: Montey Hora on 11/09/2018 10:19:31 Kolar, Rondall ([id number]) -------------------------------------------------------------------------------- Vitals Details Patient Name: [name] Date of Service: 11/09/2018 9:45 AM Medical Record Number: [id number] Patient Account Number: [id number] Date of Birth/Sex: Dec 11, 1960 (57 y.o. M) Treating RN: Army Melia Primary Care Makynna Manocchio: Johny Drilling Other Clinician: Referring Katye Valek: Johny Drilling Treating Zakai Gonyea/Extender: Melburn Hake, HOYT Weeks in Treatment: 0 Vital Signs Time Taken: 09:36 Temperature (F): 98.5 Height (in): 71 Pulse (bpm): 68 Source: Stated Respiratory Rate (breaths/min): 16 Weight (lbs): 217 Blood Pressure (mmHg): 133/94 Source: Stated Reference Range: 80 - 120 mg / dl Body Mass Index (BMI): 30.3 Electronic Signature(s) Signed: 11/09/2018 2:58:53 PM By: Army Melia Entered By: Army Melia on 11/09/2018 09:37:33

## 2018-11-10 NOTE — Progress Notes (Signed)
ZAMAN, [place] ([id number]) Visit Report for 11/09/2018 Chief Complaint Document Details Patient Name: [name], [name] Date of Service: 11/09/2018 9:45 AM Medical Record Number: [id number] Patient Account Number: [id number] Date of Birth/Sex: 1960/09/15 (57 y.o. M) Treating RN: Montey Hora Primary Care Provider: Johny Drilling Other Clinician: Referring Provider: Johny Drilling Treating Provider/Extender: Melburn Hake, HOYT Weeks in Treatment: 0 Information Obtained from: Patient Chief Complaint Bilateral LE Lymphedema Electronic Signature(s) Signed: 11/10/2018 12:14:37 AM By: Worthy Keeler PA-C Entered By: Worthy Keeler on 11/09/2018 10:10:09 [name], [name] ([id number]) -------------------------------------------------------------------------------- HPI Details Patient Name: [name] Date of Service: 11/09/2018 9:45 AM Medical Record Number: [id number] Patient Account Number: [id number] Date of Birth/Sex: [birth year] (57 y.o. M) Treating RN: Montey Hora Primary Care Provider: Johny Drilling Other Clinician: Referring Provider: Johny Drilling Treating Provider/Extender: Melburn Hake, HOYT Weeks in Treatment: 0 History of Present Illness HPI Description: 11/09/18 patient presents today for initial evaluation our clinic concerning issues that he has been having with his bilateral lower extremities. He tells me that he always has swelling has worked compression stockings he is employed at [employer] and stands on the street for most of the day. Fortunately there's no signs of active infection at this time which is good news. Unfortunately he tells me that in the past week she's been having a lot more drainage which is what prompted him to come in to see [place] here. He does not currently smoke and he is not diabetic. He does have a history of heart disease which started in his 1s much earlier than average. Subsequently he also does have a history of high blood pressure as well. No fevers, chills,  nausea, or vomiting noted at this time. Electronic Signature(s) Signed: 11/10/2018 12:14:37 AM By: Worthy Keeler PA-C Entered By: Worthy Keeler on 11/09/2018 10:31:47 [name], [name] ([id number]) -------------------------------------------------------------------------------- Physical Exam Details Patient Name: [name] Date of Service: 11/09/2018 9:45 AM Medical Record Number: [id number] Patient Account Number: [id number] Date of Birth/Sex: 23-May-1960 (57 y.o. M) Treating RN: Montey Hora Primary Care Provider: Johny Drilling Other Clinician: Referring Provider: Johny Drilling Treating Provider/Extender: Melburn Hake, HOYT Weeks in Treatment: 0 Constitutional patient is hypertensive.. pulse regular and within target range for patient.Marland Kitchen respirations regular, non-labored and within target range for patient.Marland Kitchen temperature within target range for patient.. Well-nourished and well-hydrated in no acute distress. Eyes conjunctiva clear no eyelid edema noted. pupils equal round and reactive to light and accommodation. Ears, Nose, Mouth, and Throat no gross abnormality of ear auricles or external auditory canals. normal hearing noted during conversation. mucus membranes moist. Respiratory normal breathing without difficulty. clear to auscultation bilaterally. Cardiovascular regular rate and rhythm with normal S1, S2. 2+ dorsalis pedis/posterior tibialis pulses. 1+ pitting edema of the bilateral lower extremities. Gastrointestinal (GI) soft, non-tender, non-distended, +BS. no ventral hernia noted. Musculoskeletal normal gait and posture. no significant deformity or arthritic changes, no loss or range of motion, no clubbing. Psychiatric this patient is able to make decisions and demonstrates good insight into disease process. Alert and Oriented x 3. pleasant and cooperative. Notes Upon evaluation today patient's wounds that he was telling me he had drainage from actually appear to be completely  closed at this time. He does have swelling and evidence of lymphedema unfortunately. There is no signs of active infection which is excellent news. In fact I think she's doing somewhat better but he tells me that he generally works starting tonight through the weekend and when he is working long days which are 10-12 hours a  day that he ends up with his legs being much more swollen than what we're seeing right now. Electronic Signature(s) Signed: 11/10/2018 12:14:37 AM By: Worthy Keeler PA-C Entered By: Worthy Keeler on 11/09/2018 10:33:36 [name], [name] ([id number]) -------------------------------------------------------------------------------- Physician Orders Details Patient Name: [name] Date of Service: 11/09/2018 9:45 AM Medical Record Number: [id number] Patient Account Number: [id number] Date of Birth/Sex: [birth year] (57 y.o. M) Treating RN: Montey Hora Primary Care Provider: Johny Drilling Other Clinician: Referring Provider: Johny Drilling Treating Provider/Extender: Melburn Hake, HOYT Weeks in Treatment: 0 Verbal / Phone Orders: No Diagnosis Coding ICD-10 Coding Code Description I89.0 Lymphedema, not elsewhere classified I10 Essential (primary) hypertension I25.10 Atherosclerotic heart disease of native coronary artery without angina pectoris Dressing Change Frequency o Change dressing every week Follow-up Appointments o Return Appointment in 1 week. - Monday 11/15/18 Edema Control o 3 Layer Compression System - Bilateral Electronic Signature(s) Signed: 11/09/2018 4:41:12 PM By: Montey Hora Signed: 11/10/2018 12:14:37 AM By: Worthy Keeler PA-C Entered By: Montey Hora on 11/09/2018 10:17:20 [name], [name] ([id number]) -------------------------------------------------------------------------------- Problem List Details Patient Name: [name] Date of Service: 11/09/2018 9:45 AM Medical Record Number: [id number] Patient Account Number: [id number] Date of  Birth/Sex: 14-Jul-1960 (57 y.o. M) Treating RN: Montey Hora Primary Care Provider: Johny Drilling Other Clinician: Referring Provider: Johny Drilling Treating Provider/Extender: Melburn Hake, HOYT Weeks in Treatment: 0 Active Problems ICD-10 Evaluated Encounter Code Description Active Date Today Diagnosis I89.0 Lymphedema, not elsewhere classified 11/09/2018 No Yes I10 Essential (primary) hypertension 11/09/2018 No Yes I25.10 Atherosclerotic heart disease of native coronary artery 11/09/2018 No Yes without angina pectoris Inactive Problems Resolved Problems Electronic Signature(s) Signed: 11/10/2018 12:14:37 AM By: Worthy Keeler PA-C Entered By: Worthy Keeler on 11/09/2018 10:09:55 [name], [name] ([id number]) -------------------------------------------------------------------------------- Progress Note Details Patient Name: [name] Date of Service: 11/09/2018 9:45 AM Medical Record Number: [id number] Patient Account Number: [id number] Date of Birth/Sex: 1961/01/11 (57 y.o. M) Treating RN: Montey Hora Primary Care Provider: Johny Drilling Other Clinician: Referring Provider: Johny Drilling Treating Provider/Extender: Melburn Hake, HOYT Weeks in Treatment: 0 Subjective Chief Complaint Information obtained from Patient Bilateral LE Lymphedema History of Present Illness (HPI) 11/09/18 patient presents today for initial evaluation our clinic concerning issues that he has been having with his bilateral lower extremities. He tells me that he always has swelling has worked compression stockings he is employed at [employer] and stands on the street for most of the day. Fortunately there's no signs of active infection at this time which is good news. Unfortunately he tells me that in the past week she's been having a lot more drainage which is what prompted him to come in to see [place] here. He does not currently smoke and he is not diabetic. He does have a history of heart disease which started  in his 41s much earlier than average. Subsequently he also does have a history of high blood pressure as well. No fevers, chills, nausea, or vomiting noted at this time. Patient History Information obtained from Patient. Allergies No known allergies Family History Diabetes - Mother, Heart Disease - Mother, Hypertension - Mother, No family history of Cancer, Hereditary Spherocytosis, Kidney Disease, Lung Disease, Seizures, Stroke, Thyroid Problems, Tuberculosis. Social History Never smoker, Marital Status - Married, Alcohol Use - Rarely, Drug Use - No History, Caffeine Use - Rarely. Medical History Eyes Denies history of Cataracts, Glaucoma, Optic Neuritis Ear/Nose/Mouth/Throat Denies history of Chronic sinus problems/congestion, Middle ear problems Hematologic/Lymphatic Denies history of Anemia, Hemophilia, Human Immunodeficiency Virus, Lymphedema, Sickle  Cell Disease Respiratory Denies history of Aspiration, Asthma, Chronic Obstructive Pulmonary Disease (COPD), Pneumothorax, Sleep Apnea, Tuberculosis Cardiovascular Patient has history of Coronary Artery Disease, Deep Vein Thrombosis, Hypertension, Myocardial Infarction - 30 years ago Denies history of Angina, Arrhythmia, Congestive Heart Failure, Peripheral Arterial Disease, Peripheral Venous Disease, Phlebitis, Vasculitis Gastrointestinal Denies history of Cirrhosis , Colitis, Crohn s, Hepatitis A, Hepatitis B, Hepatitis C Endocrine Arp, Rai ([id number]) Denies history of Type I Diabetes, Type II Diabetes Genitourinary Denies history of End Stage Renal Disease Immunological Denies history of Lupus Erythematosus, Raynaud s, Scleroderma Integumentary (Skin) Denies history of History of Burn, History of pressure wounds Musculoskeletal Patient has history of Gout, Rheumatoid Arthritis Denies history of Osteoarthritis, Osteomyelitis Neurologic Denies history of Dementia, Neuropathy, Quadriplegia, Paraplegia, Seizure  Disorder Oncologic Denies history of Received Chemotherapy, Received Radiation Psychiatric Denies history of Anorexia/bulimia, Confinement Anxiety Review of Systems (ROS) Constitutional Symptoms (General Health) Denies complaints or symptoms of Fatigue, Fever, Chills, Marked Weight Change. Eyes Denies complaints or symptoms of Dry Eyes, Vision Changes, Glasses / Contacts. Ear/Nose/Mouth/Throat Denies complaints or symptoms of Difficult clearing ears, Sinusitis. Hematologic/Lymphatic Denies complaints or symptoms of Bleeding / Clotting Disorders, Human Immunodeficiency Virus. Respiratory Denies complaints or symptoms of Chronic or frequent coughs, Shortness of Breath. Cardiovascular Denies complaints or symptoms of Chest pain, LE edema. Gastrointestinal Denies complaints or symptoms of Frequent diarrhea, Nausea, Vomiting. Endocrine Denies complaints or symptoms of Hepatitis, Thyroid disease, Polydypsia (Excessive Thirst). Genitourinary Denies complaints or symptoms of Kidney failure/ Dialysis, Incontinence/dribbling. Immunological Denies complaints or symptoms of Hives, Itching. Integumentary (Skin) Complains or has symptoms of Wounds. Denies complaints or symptoms of Bleeding or bruising tendency, Breakdown, Swelling. Musculoskeletal Denies complaints or symptoms of Muscle Pain, Muscle Weakness. Neurologic Denies complaints or symptoms of Numbness/parasthesias, Focal/Weakness. Oncologic prostate cancer, pollops Psychiatric Denies complaints or symptoms of Anxiety, Claustrophobia. Objective Stoltzfus, Christie ([id number]) Constitutional patient is hypertensive.. pulse regular and within target range for patient.Marland Kitchen respirations regular, non-labored and within target range for patient.Marland Kitchen temperature within target range for patient.. Well-nourished and well-hydrated in no acute distress. Vitals Time Taken: 9:36 AM, Height: 71 in, Source: Stated, Weight: 217 lbs, Source: Stated, BMI: 30.3,  Temperature: 98.5 F, Pulse: 68 bpm, Respiratory Rate: 16 breaths/min, Blood Pressure: 133/94 mmHg. Eyes conjunctiva clear no eyelid edema noted. pupils equal round and reactive to light and accommodation. Ears, Nose, Mouth, and Throat no gross abnormality of ear auricles or external auditory canals. normal hearing noted during conversation. mucus membranes moist. Respiratory normal breathing without difficulty. clear to auscultation bilaterally. Cardiovascular regular rate and rhythm with normal S1, S2. 2+ dorsalis pedis/posterior tibialis pulses. 1+ pitting edema of the bilateral lower extremities. Gastrointestinal (GI) soft, non-tender, non-distended, +BS. no ventral hernia noted. Musculoskeletal normal gait and posture. no significant deformity or arthritic changes, no loss or range of motion, no clubbing. Psychiatric this patient is able to make decisions and demonstrates good insight into disease process. Alert and Oriented x 3. pleasant and cooperative. General Notes: Upon evaluation today patient's wounds that he was telling me he had drainage from actually appear to be completely closed at this time. He does have swelling and evidence of lymphedema unfortunately. There is no signs of active infection which is excellent news. In fact I think she's doing somewhat better but he tells me that he generally works starting tonight through the weekend and when he is working long days which are 10-12 hours a day that he ends up with his legs being much more swollen than what we're seeing  right now. Other Condition(s) Patient presents with Lymphedema located on the Bilateral Leg. Assessment Active Problems ICD-10 Lymphedema, not elsewhere classified Essential (primary) hypertension Atherosclerotic heart disease of native coronary artery without angina pectoris Hooper, [name] ([id number]) Procedures There was a Three Layer Compression Therapy Procedure with a pre-treatment ABI of 1.3 by  Montey Hora, RN. Post procedure Diagnosis Wound #: Same as Pre-Procedure Plan Dressing Change Frequency: Change dressing every week Follow-up Appointments: Return Appointment in 1 week. - Monday 11/15/18 Edema Control: 3 Layer Compression System - Bilateral My suggestion at this point is gonna be that we go ahead and initiate the above wound to measures for the next week we're gonna mainly be wrapping his legs for lymphedema at this time. We will hopefully wrap them for one week and get things under control as far as the swelling is concerned and then subsequently see him on Monday for a repeat evaluation he is to bring his compression stockings at that time. Subsequently we will measure him in order to go ahead and see about ordering compression through elastic therapy in [place] as well. If anything changes or worsens will contact the office and let me know otherwise will see him next Monday for reevaluation. Please see above for specific wound care orders. We will see patient for re-evaluation in 1 week(s) here in the clinic. If anything worsens or changes patient will contact our office for additional recommendations. Electronic Signature(s) Signed: 11/10/2018 12:14:37 AM By: Worthy Keeler PA-C Entered By: Worthy Keeler on 11/09/2018 10:40:31 [name], [name] ([id number]) -------------------------------------------------------------------------------- ROS/PFSH Details Patient Name: [name] Date of Service: 11/09/2018 9:45 AM Medical Record Number: [id number] Patient Account Number: [id number] Date of Birth/Sex: 21-Nov-1960 (57 y.o. M) Treating RN: Army Melia Primary Care Provider: Johny Drilling Other Clinician: Referring Provider: Johny Drilling Treating Provider/Extender: Melburn Hake, HOYT Weeks in Treatment: 0 Information Obtained From Patient Constitutional Symptoms (General Health) Complaints and Symptoms: Negative for: Fatigue; Fever; Chills; Marked Weight  Change Eyes Complaints and Symptoms: Negative for: Dry Eyes; Vision Changes; Glasses / Contacts Medical History: Negative for: Cataracts; Glaucoma; Optic Neuritis Ear/Nose/Mouth/Throat Complaints and Symptoms: Negative for: Difficult clearing ears; Sinusitis Medical History: Negative for: Chronic sinus problems/congestion; Middle ear problems Hematologic/Lymphatic Complaints and Symptoms: Negative for: Bleeding / Clotting Disorders; Human Immunodeficiency Virus Medical History: Negative for: Anemia; Hemophilia; Human Immunodeficiency Virus; Lymphedema; Sickle Cell Disease Respiratory Complaints and Symptoms: Negative for: Chronic or frequent coughs; Shortness of Breath Medical History: Negative for: Aspiration; Asthma; Chronic Obstructive Pulmonary Disease (COPD); Pneumothorax; Sleep Apnea; Tuberculosis Cardiovascular Complaints and Symptoms: Negative for: Chest pain; LE edema Medical History: Positive for: Coronary Artery Disease; Deep Vein Thrombosis; Hypertension; Myocardial Infarction - 30 years ago Negative for: Angina; Arrhythmia; Congestive Heart Failure; Peripheral Arterial Disease; Peripheral Venous Disease; Phlebitis; Vasculitis [name], [name] ([id number]) Gastrointestinal Complaints and Symptoms: Negative for: Frequent diarrhea; Nausea; Vomiting Medical History: Negative for: Cirrhosis ; Colitis; Crohnos; Hepatitis A; Hepatitis B; Hepatitis C Endocrine Complaints and Symptoms: Negative for: Hepatitis; Thyroid disease; Polydypsia (Excessive Thirst) Medical History: Negative for: Type I Diabetes; Type II Diabetes Genitourinary Complaints and Symptoms: Negative for: Kidney failure/ Dialysis; Incontinence/dribbling Medical History: Negative for: End Stage Renal Disease Immunological Complaints and Symptoms: Negative for: Hives; Itching Medical History: Negative for: Lupus Erythematosus; Raynaudos; Scleroderma Integumentary (Skin) Complaints and  Symptoms: Positive for: Wounds Negative for: Bleeding or bruising tendency; Breakdown; Swelling Medical History: Negative for: History of Burn; History of pressure wounds Musculoskeletal Complaints and Symptoms: Negative for: Muscle Pain; Muscle Weakness Medical History: Positive for:  Gout; Rheumatoid Arthritis Negative for: Osteoarthritis; Osteomyelitis Neurologic Complaints and Symptoms: Negative for: Numbness/parasthesias; Focal/Weakness Medical History: Negative for: Dementia; Neuropathy; Quadriplegia; Paraplegia; Seizure Disorder Psychiatric [name], [name] ([id number]) Complaints and Symptoms: Negative for: Anxiety; Claustrophobia Medical History: Negative for: Anorexia/bulimia; Confinement Anxiety Oncologic Complaints and Symptoms: Review of System Notes: prostate cancer, pollops Medical History: Negative for: Received Chemotherapy; Received Radiation Immunizations Pneumococcal Vaccine: Received Pneumococcal Vaccination: No Implantable Devices None Family and Social History Cancer: No; Diabetes: Yes - Mother; Heart Disease: Yes - Mother; Hereditary Spherocytosis: No; Hypertension: Yes - Mother; Kidney Disease: No; Lung Disease: No; Seizures: No; Stroke: No; Thyroid Problems: No; Tuberculosis: No; Never smoker; Marital Status - Married; Alcohol Use: Rarely; Drug Use: No History; Caffeine Use: Rarely; Financial Concerns: No; Food, Clothing or Shelter Needs: No; Support System Lacking: No; Transportation Concerns: No Electronic Signature(s) Signed: 11/09/2018 2:58:53 PM By: Army Melia Signed: 11/10/2018 12:14:37 AM By: Worthy Keeler PA-C Entered By: Army Melia on 11/09/2018 09:47:46 [name], [name] ([id number]) -------------------------------------------------------------------------------- SuperBill Details Patient Name: [name] Date of Service: 11/09/2018 Medical Record Number: [id number] Patient Account Number: [id number] Date of Birth/Sex: June 05, 1960 (57 y.o.  M) Treating RN: Montey Hora Primary Care Provider: Johny Drilling Other Clinician: Referring Provider: Johny Drilling Treating Provider/Extender: Melburn Hake, HOYT Weeks in Treatment: 0 Diagnosis Coding ICD-10 Codes Code Description I89.0 Lymphedema, not elsewhere classified I10 Essential (primary) hypertension I25.10 Atherosclerotic heart disease of native coronary artery without angina pectoris Facility Procedures CPT4: Description Modifier Quantity Code [id number] - WOUND CARE VISIT-LEV 3 EST PT 1 CPT4: [id number] BILATERAL: Application of multi-layer venous compression system; leg (below 1 knee), including ankle and foot. Physician Procedures CPT4 Code Description: [id number] [place] PHYS LEVEL 3 o NEW PT ICD-10 Diagnosis Description I89.0 Lymphedema, not elsewhere classified I10 Essential (primary) hypertension I25.10 Atherosclerotic heart disease of native coronary artery wit Modifier: hout angina pectori Quantity: 1 s Electronic Signature(s) Signed: 11/10/2018 12:14:37 AM By: Worthy Keeler PA-C Entered By: Worthy Keeler on 11/09/2018 10:40:57

## 2018-11-15 ENCOUNTER — Other Ambulatory Visit: Payer: Self-pay

## 2018-11-15 ENCOUNTER — Encounter: Payer: Managed Care, Other (non HMO) | Attending: Physician Assistant | Admitting: Physician Assistant

## 2018-11-15 DIAGNOSIS — M069 Rheumatoid arthritis, unspecified: Secondary | ICD-10-CM | POA: Diagnosis not present

## 2018-11-15 DIAGNOSIS — I10 Essential (primary) hypertension: Secondary | ICD-10-CM | POA: Insufficient documentation

## 2018-11-15 DIAGNOSIS — I890 Lymphedema, not elsewhere classified: Secondary | ICD-10-CM | POA: Diagnosis present

## 2018-11-15 DIAGNOSIS — I252 Old myocardial infarction: Secondary | ICD-10-CM | POA: Diagnosis not present

## 2018-11-15 DIAGNOSIS — Z8249 Family history of ischemic heart disease and other diseases of the circulatory system: Secondary | ICD-10-CM | POA: Insufficient documentation

## 2018-11-15 DIAGNOSIS — Z86718 Personal history of other venous thrombosis and embolism: Secondary | ICD-10-CM | POA: Diagnosis not present

## 2018-11-15 DIAGNOSIS — I2510 Atherosclerotic heart disease of native coronary artery without angina pectoris: Secondary | ICD-10-CM | POA: Diagnosis not present

## 2018-11-15 NOTE — Progress Notes (Signed)
[name] ([id number]) Visit Report for 11/15/2018 Chief Complaint Document Details Patient Name: [name], [name] Date of Service: 11/15/2018 8:30 AM Medical Record Number: [id number] Patient Account Number: [id number] Date of Birth/Sex: Dec 15, 1960 (58 y.o. M) Treating RN: Harold Barban Primary Care Provider: Johny Drilling Other Clinician: Referring Provider: Johny Drilling Treating Provider/Extender: Melburn Hake, HOYT Weeks in Treatment: 0 Information Obtained from: Patient Chief Complaint Bilateral LE Lymphedema Electronic Signature(s) Signed: 11/15/2018 9:34:43 AM By: Worthy Keeler PA-C Entered By: Worthy Keeler on 11/15/2018 08:20:29 Trotta, Humacao ([id number]) -------------------------------------------------------------------------------- HPI Details Patient Name: [name] Date of Service: 11/15/2018 8:30 AM Medical Record Number: [id number] Patient Account Number: [id number] Date of Birth/Sex: [birth year] (58 y.o. M) Treating RN: Harold Barban Primary Care Provider: Johny Drilling Other Clinician: Referring Provider: Johny Drilling Treating Provider/Extender: Melburn Hake, HOYT Weeks in Treatment: 0 History of Present Illness HPI Description: 11/09/18 patient presents today for initial evaluation our clinic concerning issues that he has been having with his bilateral lower extremities. He tells me that he always has swelling has worked compression stockings he is employed at [employer] and stands on the street for most of the day. Fortunately there's no signs of active infection at this time which is good news. Unfortunately he tells me that in the past week she's been having a lot more drainage which is what prompted him to come in to see [place] here. He does not currently smoke and he is not diabetic. He does have a history of heart disease which started in his 38s much earlier than average. Subsequently he also does have a history of high blood pressure as well. No fevers, chills,  nausea, or vomiting noted at this time. 11/15/18 on evaluation today patient actually appears to be doing okay with regard to his lower extremities. Unfortunately after erupting last week he states that he made it to about Thursday and his legs got so swollen that he was having issues being able to even walking around. Unfortunately he missed work secondary to this issue. I did apologize obviously for the trouble that he had. With that being said I am definitely gonna give a note so that he does not have that absence from work count against him. Subsequently I think that the compression wraps or at least compression therapy in general is what he needs but I'm unsure exactly why he had such issues with the wraps as he states he did. He has seen vascular [facility] in the past although it's been greater than a year at least. I think we may want to have him see a vascular specialist for repeat we just reflect studies and consult to see if there's anything they can do to help in this regard and maybe in the end that lymphedema pumps may be the only thing to do for him. Nonetheless we are gonna measuring today to go ahead and have him order the compression stockings from [facility]. Electronic Signature(s) Signed: 11/15/2018 9:34:43 AM By: Worthy Keeler PA-C Entered By: Worthy Keeler on 11/15/2018 09:28:26 Duchesneau, Tynan ([id number]) -------------------------------------------------------------------------------- Physical Exam Details Patient Name: [name] Date of Service: 11/15/2018 8:30 AM Medical Record Number: [id number] Patient Account Number: [id number] Date of Birth/Sex: 1961-01-27 (58 y.o. M) Treating RN: Harold Barban Primary Care Provider: Johny Drilling Other Clinician: Referring Provider: Johny Drilling Treating Provider/Extender: Melburn Hake, HOYT Weeks in Treatment: 0 Constitutional Well-nourished and well-hydrated in no acute distress. Respiratory normal breathing without  difficulty. clear to auscultation bilaterally. Cardiovascular regular rate and  rhythm with normal S1, S2. Psychiatric this patient is able to make decisions and demonstrates good insight into disease process. Alert and Oriented x 3. pleasant and cooperative. Notes Upon evaluation patient's wound bed actually showed signs of fairly good epithelialization there did not appear to be any evidence of open wounds at this time unfortunately he tells me that his legs are more swollen at the end of the day and especially after he has been working. Obviously this is not the time that I about seeing him nonetheless I definitely believe that he has the edema and swelling that he tells me he has. Electronic Signature(s) Signed: 11/15/2018 9:34:43 AM By: Worthy Keeler PA-C Entered By: Worthy Keeler on 11/15/2018 09:28:58 Zehring, Jhordan ([id number]) -------------------------------------------------------------------------------- Physician Orders Details Patient Name: [name] Date of Service: 11/15/2018 8:30 AM Medical Record Number: [id number] Patient Account Number: [id number] Date of Birth/Sex: [birth year] (58 y.o. M) Treating RN: Harold Barban Primary Care Provider: Johny Drilling Other Clinician: Referring Provider: Johny Drilling Treating Provider/Extender: Melburn Hake, HOYT Weeks in Treatment: 0 Verbal / Phone Orders: No Diagnosis Coding ICD-10 Coding Code Description I89.0 Lymphedema, not elsewhere classified I10 Essential (primary) hypertension I25.10 Atherosclerotic heart disease of native coronary artery without angina pectoris Wound Cleansing o Dial antibacterial soap, wash wounds, rinse and Connor dry prior to dressing wounds o May Shower, gently Benjamin wound dry prior to applying new dressing. Secondary Dressing o ABD pad - For any possible drainage Dressing Change Frequency o Change dressing every day. - Or as needed Follow-up Appointments o Other: - Return if needed, any  wounds that open. Edema Control o Patient to wear own compression stockings - Put compressions hose on first thing in the morning, remove last thing at night. Discharge From [facility] Services o Discharge from [place] - Call with any concerns. Consults o Vascular - Venous Reflux study and consult. [facility] Electronic Signature(s) Signed: 11/15/2018 9:34:43 AM By: Worthy Keeler PA-C Signed: 11/15/2018 4:36:57 PM By: Harold Barban Entered By: Harold Barban on 11/15/2018 08:59:57 Szydlowski, Cordel ([id number]) -------------------------------------------------------------------------------- Problem List Details Patient Name: [name], Castor Date of Service: 11/15/2018 8:30 AM Medical Record Number: [id number] Patient Account Number: [id number] Date of Birth/Sex: October 19, 1960 (58 y.o. M) Treating RN: Harold Barban Primary Care Provider: Johny Drilling Other Clinician: Referring Provider: Johny Drilling Treating Provider/Extender: Melburn Hake, HOYT Weeks in Treatment: 0 Active Problems ICD-10 Evaluated Encounter Code Description Active Date Today Diagnosis I89.0 Lymphedema, not elsewhere classified 11/09/2018 No Yes I10 Essential (primary) hypertension 11/09/2018 No Yes I25.10 Atherosclerotic heart disease of native coronary artery 11/09/2018 No Yes without angina pectoris Inactive Problems Resolved Problems Electronic Signature(s) Signed: 11/15/2018 9:34:43 AM By: Worthy Keeler PA-C Entered By: Worthy Keeler on 11/15/2018 08:20:09 Lathon, Alliance ([id number]) -------------------------------------------------------------------------------- Progress Note Details Patient Name: [name] Date of Service: 11/15/2018 8:30 AM Medical Record Number: [id number] Patient Account Number: [id number] Date of Birth/Sex: 1960/08/30 (58 y.o. M) Treating RN: Harold Barban Primary Care Provider: Johny Drilling Other Clinician: Referring Provider: Johny Drilling Treating Provider/Extender: Melburn Hake,  HOYT Weeks in Treatment: 0 Subjective Chief Complaint Information obtained from Patient Bilateral LE Lymphedema History of Present Illness (HPI) 11/09/18 patient presents today for initial evaluation our clinic concerning issues that he has been having with his bilateral lower extremities. He tells me that he always has swelling has worked compression stockings he is employed at [employer] and stands on the street for most of the day. Fortunately there's no signs of active infection at this time which  is good news. Unfortunately he tells me that in the past week she's been having a lot more drainage which is what prompted him to come in to see [place] here. He does not currently smoke and he is not diabetic. He does have a history of heart disease which started in his 42s much earlier than average. Subsequently he also does have a history of high blood pressure as well. No fevers, chills, nausea, or vomiting noted at this time. 11/15/18 on evaluation today patient actually appears to be doing okay with regard to his lower extremities. Unfortunately after erupting last week he states that he made it to about Thursday and his legs got so swollen that he was having issues being able to even walking around. Unfortunately he missed work secondary to this issue. I did apologize obviously for the trouble that he had. With that being said I am definitely gonna give a note so that he does not have that absence from work count against him. Subsequently I think that the compression wraps or at least compression therapy in general is what he needs but I'm unsure exactly why he had such issues with the wraps as he states he did. He has seen vascular [place] in the past although it's been greater than a year at least. I think we may want to have him see a vascular specialist for repeat we just reflect studies and consult to see if there's anything they can do to help in this regard and maybe in the end  that lymphedema pumps may be the only thing to do for him. Nonetheless we are gonna measuring today to go ahead and have him order the compression stockings from [facility]. Patient History Information obtained from Patient. Family History Diabetes - Mother, Heart Disease - Mother, Hypertension - Mother, No family history of Cancer, Hereditary Spherocytosis, Kidney Disease, Lung Disease, Seizures, Stroke, Thyroid Problems, Tuberculosis. Social History Never smoker, Marital Status - Married, Alcohol Use - Rarely, Drug Use - No History, Caffeine Use - Rarely. Medical History Eyes Denies history of Cataracts, Glaucoma, Optic Neuritis Ear/Nose/Mouth/Throat Denies history of Chronic sinus problems/congestion, Middle ear problems Hematologic/Lymphatic Denies history of Anemia, Hemophilia, Human Immunodeficiency Virus, Lymphedema, Sickle Cell Disease Respiratory Denies history of Aspiration, Asthma, Chronic Obstructive Pulmonary Disease (COPD), Pneumothorax, Sleep Apnea, Tuberculosis Pilson, Kirklin ([id number]) Cardiovascular Patient has history of Coronary Artery Disease, Deep Vein Thrombosis, Hypertension, Myocardial Infarction - 30 years ago Denies history of Angina, Arrhythmia, Congestive Heart Failure, Peripheral Arterial Disease, Peripheral Venous Disease, Phlebitis, Vasculitis Gastrointestinal Denies history of Cirrhosis , Colitis, Crohn s, Hepatitis A, Hepatitis B, Hepatitis C Endocrine Denies history of Type I Diabetes, Type II Diabetes Genitourinary Denies history of End Stage Renal Disease Immunological Denies history of Lupus Erythematosus, Raynaud s, Scleroderma Integumentary (Skin) Denies history of History of Burn, History of pressure wounds Musculoskeletal Patient has history of Gout, Rheumatoid Arthritis Denies history of Osteoarthritis, Osteomyelitis Neurologic Denies history of Dementia, Neuropathy, Quadriplegia, Paraplegia, Seizure Disorder Oncologic Denies  history of Received Chemotherapy, Received Radiation Psychiatric Denies history of Anorexia/bulimia, Confinement Anxiety Review of Systems (ROS) Constitutional Symptoms (General Health) Denies complaints or symptoms of Fatigue, Fever, Chills, Marked Weight Change. Respiratory Denies complaints or symptoms of Chronic or frequent coughs, Shortness of Breath. Cardiovascular Complains or has symptoms of LE edema. Denies complaints or symptoms of Chest pain. Psychiatric Denies complaints or symptoms of Anxiety, Claustrophobia. Objective Constitutional Well-nourished and well-hydrated in no acute distress. Vitals Time Taken: 8:29 AM, Height: 71 in, Weight:  217 lbs, BMI: 30.3, Temperature: 98.4 F, Pulse: 70 bpm, Respiratory Rate: 16 breaths/min, Blood Pressure: 145/83 mmHg. Respiratory normal breathing without difficulty. clear to auscultation bilaterally. Cardiovascular regular rate and rhythm with normal S1, S2. Psychiatric Fratto, Traevion ([id number]) this patient is able to make decisions and demonstrates good insight into disease process. Alert and Oriented x 3. pleasant and cooperative. General Notes: Upon evaluation patient's wound bed actually showed signs of fairly good epithelialization there did not appear to be any evidence of open wounds at this time unfortunately he tells me that his legs are more swollen at the end of the day and especially after he has been working. Obviously this is not the time that I about seeing him nonetheless I definitely believe that he has the edema and swelling that he tells me he has. Other Condition(s) Patient presents with Lymphedema located on the Bilateral Leg. Assessment Active Problems ICD-10 Lymphedema, not elsewhere classified Essential (primary) hypertension Atherosclerotic heart disease of native coronary artery without angina pectoris Plan Wound Cleansing: Dial antibacterial soap, wash wounds, rinse and [place] dry prior to dressing  wounds May Shower, gently [place] wound dry prior to applying new dressing. Secondary Dressing: ABD pad - For any possible drainage Dressing Change Frequency: Change dressing every day. - Or as needed Follow-up Appointments: Other: - Return if needed, any wounds that open. Edema Control: Patient to wear own compression stockings - Put compressions hose on first thing in the morning, remove last thing at night. Discharge From [facility] Services: Discharge from [place] - Call with any concerns. Consults ordered were: Vascular - Venous Reflux study and consult. [facility] My suggestion at this point is gonna be that we go ahead and discontinue wound care services although I'm gonna make a referral for him to go see the vascular for further evaluation and treatment. They may look into lymphedema pumps if they feel that is appropriate I do think that the venous reports that he will be beneficial along with a consult for them. If anything changes or worsens in the meantime the patient will contact the office and let me know otherwise at this point of discontinuing wound care services as is really nothing more for me to provide for the care of this patient I really think that if there's nothing from intervention standpoint the vascular can do lymphedema pumps may be his best option. Obviously this is other than compression therapy and he was given measurements today to get the compression stockings from elastic therapy. We'll see him back as needed. Waverly, Benancio ([id number]) Electronic Signature(s) Signed: 11/15/2018 9:34:43 AM By: Worthy Keeler PA-C Entered By: Worthy Keeler on 11/15/2018 09:30:00 Baba, Franciszek ([id number]) -------------------------------------------------------------------------------- ROS/PFSH Details Patient Name: [name] Date of Service: 11/15/2018 8:30 AM Medical Record Number: [id number] Patient Account Number: [id number] Date of Birth/Sex: [birth year] (58 y.o. M) Treating  RN: Harold Barban Primary Care Provider: Johny Drilling Other Clinician: Referring Provider: Johny Drilling Treating Provider/Extender: Melburn Hake, HOYT Weeks in Treatment: 0 Information Obtained From Patient Constitutional Symptoms (General Health) Complaints and Symptoms: Negative for: Fatigue; Fever; Chills; Marked Weight Change Respiratory Complaints and Symptoms: Negative for: Chronic or frequent coughs; Shortness of Breath Medical History: Negative for: Aspiration; Asthma; Chronic Obstructive Pulmonary Disease (COPD); Pneumothorax; Sleep Apnea; Tuberculosis Cardiovascular Complaints and Symptoms: Positive for: LE edema Negative for: Chest pain Medical History: Positive for: Coronary Artery Disease; Deep Vein Thrombosis; Hypertension; Myocardial Infarction - 30 years ago Negative for: Angina; Arrhythmia; Congestive Heart Failure; Peripheral Arterial Disease; Peripheral  Venous Disease; Phlebitis; Vasculitis Psychiatric Complaints and Symptoms: Negative for: Anxiety; Claustrophobia Medical History: Negative for: Anorexia/bulimia; Confinement Anxiety Eyes Medical History: Negative for: Cataracts; Glaucoma; Optic Neuritis Ear/Nose/Mouth/Throat Medical History: Negative for: Chronic sinus problems/congestion; Middle ear problems Hematologic/Lymphatic Medical History: Negative for: Anemia; Hemophilia; Human Immunodeficiency Virus; Lymphedema; Sickle Cell Disease Frediani, Susie ([id number]) Gastrointestinal Medical History: Negative for: Cirrhosis ; Colitis; Crohnos; Hepatitis A; Hepatitis B; Hepatitis C Endocrine Medical History: Negative for: Type I Diabetes; Type II Diabetes Genitourinary Medical History: Negative for: End Stage Renal Disease Immunological Medical History: Negative for: Lupus Erythematosus; Raynaudos; Scleroderma Integumentary (Skin) Medical History: Negative for: History of Burn; History of pressure wounds Musculoskeletal Medical History: Positive  for: Gout; Rheumatoid Arthritis Negative for: Osteoarthritis; Osteomyelitis Neurologic Medical History: Negative for: Dementia; Neuropathy; Quadriplegia; Paraplegia; Seizure Disorder Oncologic Medical History: Negative for: Received Chemotherapy; Received Radiation Immunizations Pneumococcal Vaccine: Received Pneumococcal Vaccination: No Implantable Devices None Family and Social History Cancer: No; Diabetes: Yes - Mother; Heart Disease: Yes - Mother; Hereditary Spherocytosis: No; Hypertension: Yes - Mother; Kidney Disease: No; Lung Disease: No; Seizures: No; Stroke: No; Thyroid Problems: No; Tuberculosis: No; Never smoker; Marital Status - Married; Alcohol Use: Rarely; Drug Use: No History; Caffeine Use: Rarely; Financial Concerns: No; Food, Clothing or Shelter Needs: No; Support System Lacking: No; Transportation Concerns: No Physician Affirmation I have reviewed and agree with the above information. Electronic Signature(s) [place], Darius ([id number]) Signed: 11/15/2018 9:34:43 AM By: Worthy Keeler PA-C Signed: 11/15/2018 4:36:57 PM By: Harold Barban Entered By: Worthy Keeler on 11/15/2018 09:28:46 Back, Trooper ([id number]) -------------------------------------------------------------------------------- SuperBill Details Patient Name: [name] Date of Service: 11/15/2018 Medical Record Number: [id number] Patient Account Number: [id number] Date of Birth/Sex: October 13, 1960 (58 y.o. M) Treating RN: Harold Barban Primary Care Provider: Johny Drilling Other Clinician: Referring Provider: Johny Drilling Treating Provider/Extender: Melburn Hake, HOYT Weeks in Treatment: 0 Diagnosis Coding ICD-10 Codes Code Description I89.0 Lymphedema, not elsewhere classified I10 Essential (primary) hypertension I25.10 Atherosclerotic heart disease of native coronary artery without angina pectoris Facility Procedures CPT4 Code: [id number] Description: [phone] - WOUND CARE VISIT-LEV 2 EST  PT Modifier: Quantity: 1 Physician Procedures CPT4 Code Description: [id number] - WC PHYS LEVEL 4 - EST PT ICD-10 Diagnosis Description I89.0 Lymphedema, not elsewhere classified I10 Essential (primary) hypertension I25.10 Atherosclerotic heart disease of native coronary artery witho Modifier: ut angina pectori Quantity: 1 s Electronic Signature(s) Signed: 11/15/2018 9:34:43 AM By: Worthy Keeler PA-C Entered By: Worthy Keeler on 11/15/2018 09:30:15

## 2018-11-15 NOTE — Progress Notes (Signed)
[place], [place] ([id number]) Visit Report for 11/15/2018 Arrival Information Details Patient Name: [name], [name] Date of Service: 11/15/2018 8:30 AM Medical Record Number: [id number] Patient Account Number: [id number] Date of Birth/Sex: 17-Jul-1960 (58 y.o. M) Treating RN: Army Melia Primary Care Hansini Clodfelter: Johny Drilling Other Clinician: Referring Carmelita Amparo: Johny Drilling Treating Xochilt Conant/Extender: Melburn Hake, HOYT Weeks in Treatment: 0 Visit Information History Since Last Visit Added or deleted any medications: No Patient Arrived: Ambulatory Any new allergies or adverse reactions: No Arrival Time: 08:29 Had a fall or experienced change in No Accompanied By: self activities of daily living that may affect Transfer Assistance: None risk of falls: Patient Has Alerts: Yes Signs or symptoms of abuse/neglect since last visito No Patient Alerts: Patient on Blood Thinner Hospitalized since last visit: No Xarelto Has Dressing in Place as Prescribed: Yes Pain Present Now: No Electronic Signature(s) Signed: 11/15/2018 4:22:26 PM By: Army Melia Entered By: Army Melia on 11/15/2018 08:29:34 [place], [place] ([id number]) -------------------------------------------------------------------------------- Clinic Level of Care Assessment Details Patient Name: [name] Date of Service: 11/15/2018 8:30 AM Medical Record Number: [id number] Patient Account Number: [id number] Date of Birth/Sex: [birth year] (58 y.o. M) Treating RN: Harold Barban Primary Care Ying Blankenhorn: Johny Drilling Other Clinician: Referring Bill Yohn: Johny Drilling Treating Shalaine Payson/Extender: Melburn Hake, HOYT Weeks in Treatment: 0 Clinic Level of Care Assessment Items TOOL 4 Quantity Score []  - Use when only an EandM is performed on FOLLOW-UP visit 0 ASSESSMENTS - Nursing Assessment / Reassessment X - Reassessment of Co-morbidities (includes updates in patient status) 1 10 X- 1 5 Reassessment of Adherence to Treatment Plan ASSESSMENTS -  Wound and Skin Assessment / Reassessment []  - Simple Wound Assessment / Reassessment - one wound 0 []  - 0 Complex Wound Assessment / Reassessment - multiple wounds []  - 0 Dermatologic / Skin Assessment (not related to wound area) ASSESSMENTS - Focused Assessment []  - Circumferential Edema Measurements - multi extremities 0 []  - 0 Nutritional Assessment / Counseling / Intervention []  - 0 Lower Extremity Assessment (monofilament, tuning fork, pulses) []  - 0 Peripheral Arterial Disease Assessment (using hand held doppler) ASSESSMENTS - Ostomy and/or Continence Assessment and Care []  - Incontinence Assessment and Management 0 []  - 0 Ostomy Care Assessment and Management (repouching, etc.) PROCESS - Coordination of Care X - Simple Patient / Family Education for ongoing care 1 15 []  - 0 Complex (extensive) Patient / Family Education for ongoing care []  - 0 Staff obtains Programmer, systems, Records, Test Results / Process Orders []  - 0 Staff telephones HHA, Nursing Homes / Clarify orders / etc []  - 0 Routine Transfer to another Facility (non-emergent condition) []  - 0 Routine Hospital Admission (non-emergent condition) []  - 0 New Admissions / Biomedical engineer / Ordering NPWT, Apligraf, etc. []  - 0 Emergency Hospital Admission (emergent condition) X- 1 10 Simple Discharge Coordination [place], Edmundo ([id number]) []  - 0 Complex (extensive) Discharge Coordination PROCESS - Special Needs []  - Pediatric / Minor Patient Management 0 []  - 0 Isolation Patient Management []  - 0 Hearing / Language / Visual special needs []  - 0 Assessment of Community assistance (transportation, D/C planning, etc.) []  - 0 Additional assistance / Altered mentation []  - 0 Support Surface(s) Assessment (bed, cushion, seat, etc.) INTERVENTIONS - Wound Cleansing / Measurement []  - Simple Wound Cleansing - one wound 0 []  - 0 Complex Wound Cleansing - multiple wounds []  - 0 Wound Imaging (photographs - any  number of wounds) []  - 0 Wound Tracing (instead of photographs) []  - 0 Simple Wound Measurement - one wound []  -  0 Complex Wound Measurement - multiple wounds INTERVENTIONS - Wound Dressings []  - Small Wound Dressing one or multiple wounds 0 []  - 0 Medium Wound Dressing one or multiple wounds []  - 0 Large Wound Dressing one or multiple wounds []  - 0 Application of Medications - topical []  - 0 Application of Medications - injection INTERVENTIONS - Miscellaneous []  - External ear exam 0 []  - 0 Specimen Collection (cultures, biopsies, blood, body fluids, etc.) []  - 0 Specimen(s) / Culture(s) sent or taken to Lab for analysis []  - 0 Patient Transfer (multiple staff / Civil Service fast streamer / Similar devices) []  - 0 Simple Staple / Suture removal (25 or less) []  - 0 Complex Staple / Suture removal (26 or more) []  - 0 Hypo / Hyperglycemic Management (close monitor of Blood Glucose) []  - 0 Ankle / Brachial Index (ABI) - do not check if billed separately X- 1 5 Vital Signs Ferre, [place] ([id number]) Has the patient been seen at the hospital within the last three years: Yes Total Score: 45 Level Of Care: New/Established - Level 2 Electronic Signature(s) Signed: 11/15/2018 4:36:57 PM By: Harold Barban Entered By: Harold Barban on 11/15/2018 08:58:41 Davee, [place] ([id number]) -------------------------------------------------------------------------------- Lower Extremity Assessment Details Patient Name: [name], [name] Date of Service: 11/15/2018 8:30 AM Medical Record Number: [id number] Patient Account Number: [id number] Date of Birth/Sex: February 18, 1961 (58 y.o. M) Treating RN: Army Melia Primary Care Rumaldo Difatta: Johny Drilling Other Clinician: Referring Taygan Connell: Johny Drilling Treating Davarius Ridener/Extender: STONE III, HOYT Weeks in Treatment: 0 Edema Assessment Assessed: [Left: No] [Right: No] Edema: [Left: No] [Right: No] Vascular Assessment Pulses: Dorsalis Pedis Palpable: [Left:Yes]  [Right:Yes] Electronic Signature(s) Signed: 11/15/2018 4:22:26 PM By: Army Melia Entered By: Army Melia on 11/15/2018 08:33:47 Pankow, [place] ([id number]) -------------------------------------------------------------------------------- Multi Wound Chart Details Patient Name: [name] Date of Service: 11/15/2018 8:30 AM Medical Record Number: [id number] Patient Account Number: [id number] Date of Birth/Sex: [birth year] (58 y.o. M) Treating RN: Harold Barban Primary Care Markan Cazarez: Johny Drilling Other Clinician: Referring Daxx Tiggs: Johny Drilling Treating Tarl Cephas/Extender: Melburn Hake, HOYT Weeks in Treatment: 0 Vital Signs Height(in): 71 Pulse(bpm): 70 Weight(lbs): 217 Blood Pressure(mmHg): 145/83 Body Mass Index(BMI): 30 Temperature(F): 98.4 Respiratory Rate 16 (breaths/min): Wound Assessments Treatment Notes Electronic Signature(s) Signed: 11/15/2018 4:36:57 PM By: Harold Barban Entered By: Harold Barban on 11/15/2018 08:46:13 [place], [place] ([id number]) -------------------------------------------------------------------------------- [place] Details Patient Name: [name] Date of Service: 11/15/2018 8:30 AM Medical Record Number: [id number] Patient Account Number: [id number] Date of Birth/Sex: 1960/11/14 (58 y.o. M) Treating RN: Harold Barban Primary Care Rayder Sullenger: Johny Drilling Other Clinician: Referring Anjeli Casad: Johny Drilling Treating Jovin Fester/Extender: Melburn Hake, HOYT Weeks in Treatment: 0 Active Inactive Electronic Signature(s) Signed: 11/15/2018 4:36:57 PM By: Harold Barban Entered By: Harold Barban on 11/15/2018 09:00:36 Gautreau, Cassandra ([id number]) -------------------------------------------------------------------------------- Non-Wound Condition Assessment Details Patient Name: [name] Date of Service: 11/15/2018 8:30 AM Medical Record Number: [id number] Patient Account Number: [id number] Date of Birth/Sex: [birth year] (57 y.o.  M) Treating RN: Army Melia Primary Care Aracelis Ulrey: Johny Drilling Other Clinician: Referring Jacque Garrels: Johny Drilling Treating Saniya Tranchina/Extender: Melburn Hake, HOYT Weeks in Treatment: 0 Non-Wound Condition: Condition: Lymphedema Location: Leg Side: Bilateral Photos Electronic Signature(s) Signed: 11/15/2018 4:22:26 PM By: Army Melia Entered By: Army Melia on 11/15/2018 08:33:31 [place], [place] ([id number]) -------------------------------------------------------------------------------- Pain Assessment Details Patient Name: [name] Date of Service: 11/15/2018 8:30 AM Medical Record Number: [id number] Patient Account Number: [id number] Date of Birth/Sex: 04-04-1961 (58 y.o. M) Treating RN: Army Melia Primary Care Jonnae Fonseca: Johny Drilling Other Clinician: Referring Ashly Goethe: Johny Drilling Treating Achol Azpeitia/Extender: Joaquim Lai  III, HOYT Weeks in Treatment: 0 Active Problems Location of Pain Severity and Description of Pain Patient Has Paino No Site Locations Pain Management and Medication Current Pain Management: Electronic Signature(s) Signed: 11/15/2018 4:22:26 PM By: Army Melia Entered By: Army Melia on 11/15/2018 08:29:39 Saddler, [place] ([id number]) -------------------------------------------------------------------------------- Patient/Caregiver Education Details Patient Name: [name] Date of Service: 11/15/2018 8:30 AM Medical Record Number: [id number] Patient Account Number: [id number] Date of Birth/Gender: 19-May-1960 (58 y.o. M) Treating RN: Harold Barban Primary Care Physician: Johny Drilling Other Clinician: Referring Physician: Johny Drilling Treating Physician/Extender: Sharalyn Ink in Treatment: 0 Education Assessment Education Provided To: Patient Education Topics Provided Electronic Signature(s) Signed: 11/15/2018 4:36:57 PM By: Harold Barban Entered By: Harold Barban on 11/15/2018 08:59:01 [place]unsman, [place]  ([id number]) -------------------------------------------------------------------------------- Vitals Details Patient Name: [name] Date of Service: 11/15/2018 8:30 AM Medical Record Number: [id number] Patient Account Number: [id number] Date of Birth/Sex: [birth year] (58 y.o. M) Treating RN: Army Melia Primary Care Scot Shiraishi: Johny Drilling Other Clinician: Referring Liannah Yarbough: Johny Drilling Treating Aedyn Mckeon/Extender: Melburn Hake, HOYT Weeks in Treatment: 0 Vital Signs Time Taken: 08:29 Temperature (F): 98.4 Height (in): 71 Pulse (bpm): 70 Weight (lbs): 217 Respiratory Rate (breaths/min): 16 Body Mass Index (BMI): 30.3 Blood Pressure (mmHg): 145/83 Reference Range: 80 - 120 mg / dl Electronic Signature(s) Signed: 11/15/2018 4:22:26 PM By: Army Melia Entered By: Army Melia on 11/15/2018 08:29:53

## 2018-11-25 ENCOUNTER — Emergency Department
Admission: EM | Admit: 2018-11-25 | Discharge: 2018-11-25 | Disposition: A | Discharge status: Home / Self Care | Payer: Managed Care, Other (non HMO) | Attending: Emergency Medicine | Admitting: Emergency Medicine

## 2018-11-25 ENCOUNTER — Other Ambulatory Visit: Payer: Self-pay

## 2018-11-25 ENCOUNTER — Encounter: Payer: Self-pay | Admitting: Emergency Medicine

## 2018-11-25 DIAGNOSIS — I10 Essential (primary) hypertension: Secondary | ICD-10-CM | POA: Insufficient documentation

## 2018-11-25 DIAGNOSIS — Z79899 Other long term (current) drug therapy: Secondary | ICD-10-CM | POA: Diagnosis not present

## 2018-11-25 DIAGNOSIS — N390 Urinary tract infection, site not specified: Secondary | ICD-10-CM

## 2018-11-25 DIAGNOSIS — R300 Dysuria: Secondary | ICD-10-CM | POA: Diagnosis present

## 2018-11-25 HISTORY — DX: Malignant (primary) neoplasm, unspecified: C80.1

## 2018-11-25 HISTORY — DX: Transient cerebral ischemic attack, unspecified: G45.9

## 2018-11-25 HISTORY — DX: Gastro-esophageal reflux disease without esophagitis: K21.9

## 2018-11-25 HISTORY — DX: Unspecified osteoarthritis, unspecified site: M19.90

## 2018-11-25 HISTORY — DX: Hyperlipidemia, unspecified: E78.5

## 2018-11-25 HISTORY — DX: Essential (primary) hypertension: I10

## 2018-11-25 HISTORY — DX: Cerebral infarction, unspecified: I63.9

## 2018-11-25 LAB — URINALYSIS, COMPLETE (UACMP) WITH MICROSCOPIC
Bacteria, UA: NONE SEEN
Bilirubin Urine: NEGATIVE
Glucose, UA: NEGATIVE mg/dL
Ketones, ur: NEGATIVE mg/dL
Nitrite: NEGATIVE
Protein, ur: 100 mg/dL — AB
RBC / HPF: >50 RBC/hpf — ABNORMAL HIGH (ref 0–5)
Specific Gravity, Urine: 1.033 — ABNORMAL HIGH (ref 1.005–1.030)
Squamous Epithelial / LPF: NONE SEEN (ref 0–5)
WBC, UA: >50 WBC/hpf — ABNORMAL HIGH (ref 0–5)
pH: 5 (ref 5.0–8.0)

## 2018-11-25 MED ORDER — PHENAZOPYRIDINE HCL 200 MG PO TABS: 200.0000 mg | ORAL_TABLET | Freq: Three times a day (TID) | ORAL | 0 refills | Status: AC | PRN

## 2018-11-25 MED ORDER — DOXYCYCLINE MONOHYDRATE 100 MG PO CAPS: 100.0000 mg | ORAL_CAPSULE | Freq: Two times a day (BID) | ORAL | 0 refills | Status: AC

## 2018-11-25 NOTE — Discharge Instructions (Addendum)
Advised to contact [facility] for follow-up of UTI secondary to your medical condition.

## 2018-11-25 NOTE — ED Notes (Signed)
See triage note  Presents with some dysuria    Denies any penile discharge  Afebrile on arrival but states he has had some chills

## 2018-11-25 NOTE — ED Provider Notes (Addendum)
[facility] Emergency Department Provider Note   ____________________________________________   First MD Initiated Contact with Patient 11/25/18 [phone]     (approximate)  I have reviewed the triage vital signs and the nursing notes.   HISTORY  Chief Complaint Dysuria    HPI [name] is a 58 y.o. male patient presents with dysuria and chills for 3 days.  Patient denies urethral discharge.  Patient denies concern for STD.  Patient denies abdominal or back pain.  Patient denies hesitancy, frequency or urinary retention.  Patient initially thought his complaint was secondary to cayenne pepper that his wife placed in his tea.  Patient contacted urology yesterday and was encouraged to perform at home urine dipstick which was reportedly positive for leukocytes and nitrates.  Patient was encouraged to follow-up with urgent care or emergency room department.  Patient was diagnosed with prostate adenocarcinoma involving less than 1 mm of tissue biopsy performed in January 2020.         Past Medical History:  Diagnosis Date  . Arthritis   . Cancer ([place])   . GERD (gastroesophageal reflux disease)   . Hyperlipidemia   . Hypertension   . Stroke ([place])   . TIA (transient ischemic attack)     There are no active problems to display for this patient.   History reviewed. No pertinent surgical history.  Prior to Admission medications   Medication Sig Start Date End Date Taking? Authorizing Provider  allopurinol (ZYLOPRIM) 300 MG tablet Take 300 mg by mouth daily.   Yes [provider]  atorvastatin (LIPITOR) 80 MG tablet Take 80 mg by mouth daily.   Yes [provider]  gabapentin (NEURONTIN) 300 MG capsule Take 300 mg by mouth 3 (three) times daily.   Yes [provider]  omeprazole (PRILOSEC) 40 MG capsule Take 40 mg by mouth daily.   Yes [provider]  rivaroxaban (XARELTO) 20 MG TABS tablet Take 20 mg by mouth daily with  supper.   Yes [provider]  doxycycline (MONODOX) 100 MG capsule Take 1 capsule (100 mg total) by mouth 2 (two) times daily. 11/25/18   Sable Feil, PA-C  phenazopyridine (PYRIDIUM) 200 MG tablet Take 1 tablet (200 mg total) by mouth 3 (three) times daily as needed for pain. 11/25/18   Sable Feil, PA-C    Allergies Patient has no known allergies.  No family history on file.  Social History Social History   Tobacco Use  . Smoking status: Never Smoker  . Smokeless tobacco: Never Used  Substance Use Topics  . Alcohol use: Not on file  . Drug use: Not on file    Review of Systems  Constitutional: No fever/chills Eyes: No visual changes. ENT: No sore throat. Cardiovascular: Denies chest pain. Respiratory: Denies shortness of breath. Gastrointestinal: No abdominal pain.  No nausea, no vomiting.  No diarrhea.  No constipation. Genitourinary: Positive for dysuria.  Prostate cancer Musculoskeletal: Negative for back pain. Skin: Negative for rash. Neurological: Negative for headaches, focal weakness or numbness. Endocrine:  Hyperlipidemia  ____________________________________________   PHYSICAL EXAM:  VITAL SIGNS: ED Triage Vitals  Enc Vitals Group     BP 11/25/18 0653 113/78     Pulse Rate 11/25/18 0653 97     Resp 11/25/18 0653 18     Temp 11/25/18 0653 98.8 F (37.1 C)     Temp Source 11/25/18 0653 Oral     SpO2 11/25/18 0653 100 %     Weight --  Height --      Head Circumference --      Peak Flow --      Pain Score 11/25/18 0651 0     Pain Loc --      Pain Edu? --      Excl. in [place]? --     Constitutional: Alert and oriented. Well appearing and in no acute distress. Cardiovascular: Normal rate, regular rhythm. Grossly normal heart sounds.  Good peripheral circulation. Respiratory: Normal respiratory effort.  No retractions. Lungs CTAB. Gastrointestinal: Soft and nontender. No distention. No abdominal bruits. No CVA tenderness.  Genitourinary: No lesions or recent discharge. Neurologic:  Normal speech and language. No gross focal neurologic deficits are appreciated. No gait instability. Skin:  Skin is warm, dry and intact. No rash noted. Psychiatric: Mood and affect are normal. Speech and behavior are normal.  ____________________________________________   LABS (all labs ordered are listed, but only abnormal results are displayed)  Labs Reviewed  URINALYSIS, COMPLETE (UACMP) WITH MICROSCOPIC - Abnormal; Notable for the following components:      Result Value   Color, Urine AMBER (*)    APPearance TURBID (*)    Specific Gravity, Urine 1.033 (*)    Hgb urine dipstick LARGE (*)    Protein, ur 100 (*)    Leukocytes,Ua SMALL (*)    RBC / HPF >50 (*)    WBC, UA >50 (*)    All other components within normal limits  URINE CULTURE   ____________________________________________  EKG   ____________________________________________  RADIOLOGY  ED MD interpretation:    Official radiology report(s): No results found.  ____________________________________________   PROCEDURES  Procedure(s) performed (including Critical Care):  Procedures   ____________________________________________   INITIAL IMPRESSION / ASSESSMENT AND PLAN / ED COURSE  As part of my medical decision making, I reviewed the following data within the [place] was evaluated in Emergency Department on 11/25/2018 for the symptoms described in the history of present illness. He was evaluated in the context of the global COVID-19 pandemic, which necessitated consideration that the patient might be at risk for infection with the SARS-CoV-2 virus that causes COVID-19. Institutional protocols and algorithms that pertain to the evaluation of patients at risk for COVID-19 are in a state of rapid change based on information released by regulatory bodies including the CDC and federal and state  organizations. These policies and algorithms were followed during the patient's care in the ED.    Patient presents with dysuria.  Differential consist of urinary tract infection, prostatitis, or pyelonephritis.  Patient given discharge care instruction advised to contact treating urologist for further evaluation.   ____________________________________________   FINAL CLINICAL IMPRESSION(S) / ED DIAGNOSES  Final diagnoses:  Lower urinary tract infectious disease     ED Discharge Orders         Ordered    doxycycline (MONODOX) 100 MG capsule  2 times daily     11/25/18 0748    phenazopyridine (PYRIDIUM) 200 MG tablet  3 times daily PRN     11/25/18 0748           Note:  This document was prepared using Dragon voice recognition software and may include unintentional dictation errors.    Sable Feil, PA-C 11/25/18 0752    Sable Feil, PA-C 11/25/18 3545    Delman Kitten, MD 12/24/18 1027

## 2018-11-25 NOTE — ED Triage Notes (Signed)
Patient ambulatory to triage with steady gait, without difficulty or distress noted, mask in place; pt reports since Monday having dysuria and chills; denies any abd/back pain

## 2018-11-27 LAB — URINE CULTURE
Culture: >=100000 — AB
Special Requests: NORMAL

## 2018-11-28 NOTE — Progress Notes (Signed)
ED Antimicrobial Stewardship Positive Culture Follow Up   Tayari Yankee is an 58 y.o. male who presented to [facility] on 11/25/2018 with a chief complaint of  Chief Complaint  Patient presents with  . Dysuria    Recent Results (from the past 720 hour(s))  Urine culture     Status: Abnormal   Collection Time: 11/25/18  7:00 AM   Specimen: Urine, Clean Catch  Result Value Ref Range Status   Specimen Description   Final    URINE, CLEAN CATCH Performed at [facility], [address] [place], [place] [address]    Special Requests   Final    Normal Performed at [facility], [address] [place], [place] [address]    Culture >=100,000 COLONIES/mL ESCHERICHIA COLI (A)  Final   Report Status 11/27/2018 FINAL  Final   Organism ID, Bacteria ESCHERICHIA COLI (A)  Final      Susceptibility   Escherichia coli - MIC*    AMPICILLIN <=2 SENSITIVE Sensitive     CEFAZOLIN <=4 SENSITIVE Sensitive     CEFTRIAXONE <=1 SENSITIVE Sensitive     CIPROFLOXACIN <=0.25 SENSITIVE Sensitive     GENTAMICIN <=1 SENSITIVE Sensitive     IMIPENEM <=0.25 SENSITIVE Sensitive     NITROFURANTOIN <=16 SENSITIVE Sensitive     TRIMETH/SULFA <=20 SENSITIVE Sensitive     AMPICILLIN/SULBACTAM <=2 SENSITIVE Sensitive     PIP/TAZO <=4 SENSITIVE Sensitive     Extended ESBL NEGATIVE Sensitive     * >=100,000 COLONIES/mL ESCHERICHIA COLI    [x]  Treated with Doxycycline, organism resistant to prescribed antimicrobial  New antibiotic prescription: Cephalexin 500mg  q6h x 5 days  ED Provider: Arta Silence  Spoke with pt wife who asked me to call in rx to [facility] on the corner of [address] and [address].  Rx called in [phone] to Jackqulyn Livings Rph/PharmD @ [facility] 7/19   Lu Duffel, PharmD, BCPS Clinical Pharmacist 11/28/2018 5:39 PM

## 2021-11-05 ENCOUNTER — Ambulatory Visit: Admission: RE | Admit: 2021-11-05 | Payer: Commercial Managed Care - HMO | Source: Home / Self Care

## 2021-11-05 ENCOUNTER — Encounter: Admission: RE | Payer: Self-pay | Source: Home / Self Care

## 2021-11-05 SURGERY — COLONOSCOPY WITH PROPOFOL
Anesthesia: General

## 2022-03-13 DIAGNOSIS — I2510 Atherosclerotic heart disease of native coronary artery without angina pectoris: Secondary | ICD-10-CM | POA: Diagnosis not present

## 2022-03-13 DIAGNOSIS — I82423 Acute embolism and thrombosis of iliac vein, bilateral: Secondary | ICD-10-CM | POA: Diagnosis not present

## 2022-03-13 DIAGNOSIS — Z09 Encounter for follow-up examination after completed treatment for conditions other than malignant neoplasm: Secondary | ICD-10-CM | POA: Diagnosis not present

## 2022-03-13 DIAGNOSIS — M79604 Pain in right leg: Secondary | ICD-10-CM | POA: Diagnosis not present

## 2022-03-13 DIAGNOSIS — M7989 Other specified soft tissue disorders: Secondary | ICD-10-CM | POA: Diagnosis not present

## 2022-03-13 DIAGNOSIS — Z86718 Personal history of other venous thrombosis and embolism: Secondary | ICD-10-CM | POA: Diagnosis not present

## 2022-03-13 DIAGNOSIS — Z79899 Other long term (current) drug therapy: Secondary | ICD-10-CM | POA: Diagnosis not present

## 2022-03-13 DIAGNOSIS — R202 Paresthesia of skin: Secondary | ICD-10-CM | POA: Diagnosis not present

## 2022-03-13 DIAGNOSIS — Z7901 Long term (current) use of anticoagulants: Secondary | ICD-10-CM | POA: Diagnosis not present

## 2022-03-13 DIAGNOSIS — I824Z3 Acute embolism and thrombosis of unspecified deep veins of distal lower extremity, bilateral: Secondary | ICD-10-CM | POA: Diagnosis not present

## 2022-03-13 DIAGNOSIS — I872 Venous insufficiency (chronic) (peripheral): Secondary | ICD-10-CM | POA: Diagnosis not present

## 2022-03-13 DIAGNOSIS — M79605 Pain in left leg: Secondary | ICD-10-CM | POA: Diagnosis not present

## 2022-03-13 DIAGNOSIS — Z7982 Long term (current) use of aspirin: Secondary | ICD-10-CM | POA: Diagnosis not present

## 2022-03-27 DIAGNOSIS — R531 Weakness: Secondary | ICD-10-CM | POA: Diagnosis not present

## 2022-03-27 DIAGNOSIS — R200 Anesthesia of skin: Secondary | ICD-10-CM | POA: Diagnosis not present

## 2022-03-27 DIAGNOSIS — G453 Amaurosis fugax: Secondary | ICD-10-CM | POA: Diagnosis not present

## 2022-04-07 DIAGNOSIS — I253 Aneurysm of heart: Secondary | ICD-10-CM | POA: Diagnosis not present

## 2022-04-07 DIAGNOSIS — I10 Essential (primary) hypertension: Secondary | ICD-10-CM | POA: Diagnosis not present

## 2022-04-07 DIAGNOSIS — I6300 Cerebral infarction due to thrombosis of unspecified precerebral artery: Secondary | ICD-10-CM | POA: Diagnosis not present

## 2022-04-07 DIAGNOSIS — E782 Mixed hyperlipidemia: Secondary | ICD-10-CM | POA: Diagnosis not present

## 2022-04-07 DIAGNOSIS — R55 Syncope and collapse: Secondary | ICD-10-CM | POA: Diagnosis not present

## 2022-04-07 DIAGNOSIS — R002 Palpitations: Secondary | ICD-10-CM | POA: Diagnosis not present

## 2022-04-07 DIAGNOSIS — Z86718 Personal history of other venous thrombosis and embolism: Secondary | ICD-10-CM | POA: Diagnosis not present

## 2022-04-07 DIAGNOSIS — I2510 Atherosclerotic heart disease of native coronary artery without angina pectoris: Secondary | ICD-10-CM | POA: Diagnosis not present

## 2022-04-07 DIAGNOSIS — E785 Hyperlipidemia, unspecified: Secondary | ICD-10-CM | POA: Diagnosis not present

## 2022-04-07 DIAGNOSIS — Z8673 Personal history of transient ischemic attack (TIA), and cerebral infarction without residual deficits: Secondary | ICD-10-CM | POA: Diagnosis not present

## 2022-04-10 DIAGNOSIS — Z86718 Personal history of other venous thrombosis and embolism: Secondary | ICD-10-CM | POA: Diagnosis not present

## 2022-04-10 DIAGNOSIS — Z23 Encounter for immunization: Secondary | ICD-10-CM | POA: Diagnosis not present

## 2022-04-10 DIAGNOSIS — Z2821 Immunization not carried out because of patient refusal: Secondary | ICD-10-CM | POA: Diagnosis not present

## 2022-04-24 DIAGNOSIS — I82421 Acute embolism and thrombosis of right iliac vein: Secondary | ICD-10-CM | POA: Diagnosis not present

## 2022-04-24 DIAGNOSIS — I82411 Acute embolism and thrombosis of right femoral vein: Secondary | ICD-10-CM | POA: Diagnosis not present

## 2022-05-16 DIAGNOSIS — C61 Malignant neoplasm of prostate: Secondary | ICD-10-CM | POA: Diagnosis not present

## 2022-06-16 DIAGNOSIS — K0889 Other specified disorders of teeth and supporting structures: Secondary | ICD-10-CM | POA: Diagnosis not present

## 2022-06-19 DIAGNOSIS — Z23 Encounter for immunization: Secondary | ICD-10-CM | POA: Diagnosis not present

## 2022-06-19 DIAGNOSIS — K0889 Other specified disorders of teeth and supporting structures: Secondary | ICD-10-CM | POA: Diagnosis not present

## 2022-06-19 DIAGNOSIS — R519 Headache, unspecified: Secondary | ICD-10-CM | POA: Diagnosis not present

## 2022-07-14 DIAGNOSIS — Z0189 Encounter for other specified special examinations: Secondary | ICD-10-CM | POA: Diagnosis not present

## 2022-07-14 DIAGNOSIS — Z7901 Long term (current) use of anticoagulants: Secondary | ICD-10-CM | POA: Diagnosis not present

## 2022-07-14 DIAGNOSIS — Z86718 Personal history of other venous thrombosis and embolism: Secondary | ICD-10-CM | POA: Diagnosis not present

## 2022-07-18 DIAGNOSIS — I6300 Cerebral infarction due to thrombosis of unspecified precerebral artery: Secondary | ICD-10-CM | POA: Diagnosis not present

## 2022-07-18 DIAGNOSIS — C61 Malignant neoplasm of prostate: Secondary | ICD-10-CM | POA: Diagnosis not present

## 2022-07-18 DIAGNOSIS — Z01818 Encounter for other preprocedural examination: Secondary | ICD-10-CM | POA: Diagnosis not present

## 2022-07-18 DIAGNOSIS — K219 Gastro-esophageal reflux disease without esophagitis: Secondary | ICD-10-CM | POA: Diagnosis not present

## 2022-07-18 DIAGNOSIS — I10 Essential (primary) hypertension: Secondary | ICD-10-CM | POA: Diagnosis not present

## 2022-07-18 DIAGNOSIS — Z86718 Personal history of other venous thrombosis and embolism: Secondary | ICD-10-CM | POA: Diagnosis not present

## 2022-07-18 DIAGNOSIS — I2510 Atherosclerotic heart disease of native coronary artery without angina pectoris: Secondary | ICD-10-CM | POA: Diagnosis not present

## 2022-07-18 DIAGNOSIS — Z86010 Personal history of colonic polyps: Secondary | ICD-10-CM | POA: Diagnosis not present

## 2022-07-18 DIAGNOSIS — R002 Palpitations: Secondary | ICD-10-CM | POA: Diagnosis not present

## 2022-07-18 DIAGNOSIS — I253 Aneurysm of heart: Secondary | ICD-10-CM | POA: Diagnosis not present

## 2022-07-18 DIAGNOSIS — R7303 Prediabetes: Secondary | ICD-10-CM | POA: Diagnosis not present

## 2022-07-18 DIAGNOSIS — E782 Mixed hyperlipidemia: Secondary | ICD-10-CM | POA: Diagnosis not present

## 2022-07-18 DIAGNOSIS — M153 Secondary multiple arthritis: Secondary | ICD-10-CM | POA: Diagnosis not present

## 2022-07-18 DIAGNOSIS — Z7901 Long term (current) use of anticoagulants: Secondary | ICD-10-CM | POA: Diagnosis not present

## 2022-07-18 DIAGNOSIS — Z8673 Personal history of transient ischemic attack (TIA), and cerebral infarction without residual deficits: Secondary | ICD-10-CM | POA: Diagnosis not present

## 2022-08-04 DIAGNOSIS — Z86010 Personal history of colonic polyps: Secondary | ICD-10-CM | POA: Diagnosis not present

## 2022-08-04 DIAGNOSIS — K5730 Diverticulosis of large intestine without perforation or abscess without bleeding: Secondary | ICD-10-CM | POA: Diagnosis not present

## 2022-08-04 DIAGNOSIS — I10 Essential (primary) hypertension: Secondary | ICD-10-CM | POA: Diagnosis not present

## 2022-08-04 DIAGNOSIS — I2510 Atherosclerotic heart disease of native coronary artery without angina pectoris: Secondary | ICD-10-CM | POA: Diagnosis not present

## 2022-08-04 DIAGNOSIS — K219 Gastro-esophageal reflux disease without esophagitis: Secondary | ICD-10-CM | POA: Diagnosis not present

## 2022-08-04 DIAGNOSIS — Z7951 Long term (current) use of inhaled steroids: Secondary | ICD-10-CM | POA: Diagnosis not present

## 2022-08-04 DIAGNOSIS — Z1211 Encounter for screening for malignant neoplasm of colon: Secondary | ICD-10-CM | POA: Diagnosis not present

## 2022-08-06 DIAGNOSIS — I253 Aneurysm of heart: Secondary | ICD-10-CM | POA: Diagnosis not present

## 2022-08-06 DIAGNOSIS — R002 Palpitations: Secondary | ICD-10-CM | POA: Diagnosis not present

## 2022-08-06 DIAGNOSIS — Z86718 Personal history of other venous thrombosis and embolism: Secondary | ICD-10-CM | POA: Diagnosis not present

## 2022-08-06 DIAGNOSIS — E782 Mixed hyperlipidemia: Secondary | ICD-10-CM | POA: Diagnosis not present

## 2022-08-06 DIAGNOSIS — R55 Syncope and collapse: Secondary | ICD-10-CM | POA: Diagnosis not present

## 2022-08-06 DIAGNOSIS — I2510 Atherosclerotic heart disease of native coronary artery without angina pectoris: Secondary | ICD-10-CM | POA: Diagnosis not present

## 2022-08-06 DIAGNOSIS — G4733 Obstructive sleep apnea (adult) (pediatric): Secondary | ICD-10-CM | POA: Diagnosis not present

## 2022-08-06 DIAGNOSIS — I6300 Cerebral infarction due to thrombosis of unspecified precerebral artery: Secondary | ICD-10-CM | POA: Diagnosis not present

## 2022-08-06 DIAGNOSIS — Z7901 Long term (current) use of anticoagulants: Secondary | ICD-10-CM | POA: Diagnosis not present

## 2022-08-06 DIAGNOSIS — I10 Essential (primary) hypertension: Secondary | ICD-10-CM | POA: Diagnosis not present

## 2022-08-06 DIAGNOSIS — Z8673 Personal history of transient ischemic attack (TIA), and cerebral infarction without residual deficits: Secondary | ICD-10-CM | POA: Diagnosis not present

## 2022-12-12 DIAGNOSIS — Z7901 Long term (current) use of anticoagulants: Secondary | ICD-10-CM | POA: Diagnosis not present

## 2022-12-12 DIAGNOSIS — I87009 Postthrombotic syndrome without complications of unspecified extremity: Secondary | ICD-10-CM | POA: Diagnosis not present

## 2022-12-12 DIAGNOSIS — Z8679 Personal history of other diseases of the circulatory system: Secondary | ICD-10-CM | POA: Diagnosis not present

## 2022-12-12 DIAGNOSIS — Z86718 Personal history of other venous thrombosis and embolism: Secondary | ICD-10-CM | POA: Diagnosis not present

## 2022-12-12 DIAGNOSIS — Z6834 Body mass index (BMI) 34.0-34.9, adult: Secondary | ICD-10-CM | POA: Diagnosis not present

## 2022-12-12 DIAGNOSIS — I253 Aneurysm of heart: Secondary | ICD-10-CM | POA: Diagnosis not present

## 2022-12-12 DIAGNOSIS — Z8546 Personal history of malignant neoplasm of prostate: Secondary | ICD-10-CM | POA: Diagnosis not present

## 2022-12-12 DIAGNOSIS — Z8673 Personal history of transient ischemic attack (TIA), and cerebral infarction without residual deficits: Secondary | ICD-10-CM | POA: Diagnosis not present

## 2022-12-12 DIAGNOSIS — R7989 Other specified abnormal findings of blood chemistry: Secondary | ICD-10-CM | POA: Diagnosis not present

## 2023-01-06 DIAGNOSIS — I824Z3 Acute embolism and thrombosis of unspecified deep veins of distal lower extremity, bilateral: Secondary | ICD-10-CM | POA: Diagnosis not present

## 2023-01-09 DIAGNOSIS — R7989 Other specified abnormal findings of blood chemistry: Secondary | ICD-10-CM | POA: Diagnosis not present

## 2023-01-09 DIAGNOSIS — R21 Rash and other nonspecific skin eruption: Secondary | ICD-10-CM | POA: Diagnosis not present

## 2023-01-15 DIAGNOSIS — Z8546 Personal history of malignant neoplasm of prostate: Secondary | ICD-10-CM | POA: Diagnosis not present

## 2023-01-15 DIAGNOSIS — C61 Malignant neoplasm of prostate: Secondary | ICD-10-CM | POA: Diagnosis not present

## 2023-01-15 DIAGNOSIS — Z08 Encounter for follow-up examination after completed treatment for malignant neoplasm: Secondary | ICD-10-CM | POA: Diagnosis not present

## 2023-02-02 DIAGNOSIS — G44219 Episodic tension-type headache, not intractable: Secondary | ICD-10-CM | POA: Diagnosis not present

## 2023-02-09 DIAGNOSIS — L97911 Non-pressure chronic ulcer of unspecified part of right lower leg limited to breakdown of skin: Secondary | ICD-10-CM | POA: Diagnosis not present

## 2023-02-09 DIAGNOSIS — Z86718 Personal history of other venous thrombosis and embolism: Secondary | ICD-10-CM | POA: Diagnosis not present

## 2023-02-09 DIAGNOSIS — I87009 Postthrombotic syndrome without complications of unspecified extremity: Secondary | ICD-10-CM | POA: Diagnosis not present

## 2023-02-09 DIAGNOSIS — Z8673 Personal history of transient ischemic attack (TIA), and cerebral infarction without residual deficits: Secondary | ICD-10-CM | POA: Diagnosis not present

## 2023-02-09 DIAGNOSIS — Z8546 Personal history of malignant neoplasm of prostate: Secondary | ICD-10-CM | POA: Diagnosis not present

## 2023-02-09 DIAGNOSIS — Z6834 Body mass index (BMI) 34.0-34.9, adult: Secondary | ICD-10-CM | POA: Diagnosis not present

## 2023-02-09 DIAGNOSIS — Z8679 Personal history of other diseases of the circulatory system: Secondary | ICD-10-CM | POA: Diagnosis not present

## 2023-02-09 DIAGNOSIS — I253 Aneurysm of heart: Secondary | ICD-10-CM | POA: Diagnosis not present

## 2023-02-09 DIAGNOSIS — R7989 Other specified abnormal findings of blood chemistry: Secondary | ICD-10-CM | POA: Diagnosis not present

## 2023-02-12 DIAGNOSIS — I890 Lymphedema, not elsewhere classified: Secondary | ICD-10-CM | POA: Diagnosis not present

## 2023-02-12 DIAGNOSIS — I872 Venous insufficiency (chronic) (peripheral): Secondary | ICD-10-CM | POA: Diagnosis not present

## 2023-03-17 DIAGNOSIS — Z86718 Personal history of other venous thrombosis and embolism: Secondary | ICD-10-CM | POA: Diagnosis not present

## 2023-03-17 DIAGNOSIS — I82511 Chronic embolism and thrombosis of right femoral vein: Secondary | ICD-10-CM | POA: Diagnosis not present

## 2023-03-31 DIAGNOSIS — I890 Lymphedema, not elsewhere classified: Secondary | ICD-10-CM | POA: Diagnosis not present

## 2023-03-31 DIAGNOSIS — R600 Localized edema: Secondary | ICD-10-CM | POA: Diagnosis not present

## 2023-04-17 DIAGNOSIS — R600 Localized edema: Secondary | ICD-10-CM | POA: Diagnosis not present

## 2023-04-17 DIAGNOSIS — I890 Lymphedema, not elsewhere classified: Secondary | ICD-10-CM | POA: Diagnosis not present

## 2023-04-22 DIAGNOSIS — Z872 Personal history of diseases of the skin and subcutaneous tissue: Secondary | ICD-10-CM | POA: Diagnosis not present

## 2023-04-22 DIAGNOSIS — Z09 Encounter for follow-up examination after completed treatment for conditions other than malignant neoplasm: Secondary | ICD-10-CM | POA: Diagnosis not present

## 2023-04-23 DIAGNOSIS — R600 Localized edema: Secondary | ICD-10-CM | POA: Diagnosis not present

## 2023-04-23 DIAGNOSIS — I890 Lymphedema, not elsewhere classified: Secondary | ICD-10-CM | POA: Diagnosis not present

## 2023-04-24 DIAGNOSIS — I10 Essential (primary) hypertension: Secondary | ICD-10-CM | POA: Diagnosis not present

## 2023-04-24 DIAGNOSIS — E782 Mixed hyperlipidemia: Secondary | ICD-10-CM | POA: Diagnosis not present

## 2023-04-24 DIAGNOSIS — C61 Malignant neoplasm of prostate: Secondary | ICD-10-CM | POA: Diagnosis not present

## 2023-04-24 DIAGNOSIS — I890 Lymphedema, not elsewhere classified: Secondary | ICD-10-CM | POA: Diagnosis not present

## 2023-04-24 DIAGNOSIS — M153 Secondary multiple arthritis: Secondary | ICD-10-CM | POA: Diagnosis not present

## 2023-04-24 DIAGNOSIS — K219 Gastro-esophageal reflux disease without esophagitis: Secondary | ICD-10-CM | POA: Diagnosis not present

## 2023-04-24 DIAGNOSIS — Z86718 Personal history of other venous thrombosis and embolism: Secondary | ICD-10-CM | POA: Diagnosis not present

## 2023-04-24 DIAGNOSIS — R7303 Prediabetes: Secondary | ICD-10-CM | POA: Diagnosis not present

## 2023-04-24 DIAGNOSIS — N401 Enlarged prostate with lower urinary tract symptoms: Secondary | ICD-10-CM | POA: Diagnosis not present

## 2023-04-24 DIAGNOSIS — R350 Frequency of micturition: Secondary | ICD-10-CM | POA: Diagnosis not present

## 2023-04-24 DIAGNOSIS — M1000 Idiopathic gout, unspecified site: Secondary | ICD-10-CM | POA: Diagnosis not present

## 2023-04-24 DIAGNOSIS — I2510 Atherosclerotic heart disease of native coronary artery without angina pectoris: Secondary | ICD-10-CM | POA: Diagnosis not present

## 2023-04-29 DIAGNOSIS — R600 Localized edema: Secondary | ICD-10-CM | POA: Diagnosis not present

## 2023-04-29 DIAGNOSIS — I890 Lymphedema, not elsewhere classified: Secondary | ICD-10-CM | POA: Diagnosis not present

## 2023-05-12 DIAGNOSIS — R7989 Other specified abnormal findings of blood chemistry: Secondary | ICD-10-CM | POA: Diagnosis not present

## 2023-05-12 DIAGNOSIS — I87009 Postthrombotic syndrome without complications of unspecified extremity: Secondary | ICD-10-CM | POA: Diagnosis not present

## 2023-05-12 DIAGNOSIS — E669 Obesity, unspecified: Secondary | ICD-10-CM | POA: Diagnosis not present

## 2023-05-12 DIAGNOSIS — Z8673 Personal history of transient ischemic attack (TIA), and cerebral infarction without residual deficits: Secondary | ICD-10-CM | POA: Diagnosis not present

## 2023-05-12 DIAGNOSIS — Z6834 Body mass index (BMI) 34.0-34.9, adult: Secondary | ICD-10-CM | POA: Diagnosis not present

## 2023-05-12 DIAGNOSIS — I253 Aneurysm of heart: Secondary | ICD-10-CM | POA: Diagnosis not present

## 2023-05-12 DIAGNOSIS — Z8679 Personal history of other diseases of the circulatory system: Secondary | ICD-10-CM | POA: Diagnosis not present

## 2023-05-12 DIAGNOSIS — Z8546 Personal history of malignant neoplasm of prostate: Secondary | ICD-10-CM | POA: Diagnosis not present

## 2023-05-12 DIAGNOSIS — Z86718 Personal history of other venous thrombosis and embolism: Secondary | ICD-10-CM | POA: Diagnosis not present

## 2023-06-16 DIAGNOSIS — Z8546 Personal history of malignant neoplasm of prostate: Secondary | ICD-10-CM | POA: Diagnosis not present

## 2023-06-16 DIAGNOSIS — Z86718 Personal history of other venous thrombosis and embolism: Secondary | ICD-10-CM | POA: Diagnosis not present

## 2023-06-16 DIAGNOSIS — Z8679 Personal history of other diseases of the circulatory system: Secondary | ICD-10-CM | POA: Diagnosis not present

## 2023-06-16 DIAGNOSIS — Z8673 Personal history of transient ischemic attack (TIA), and cerebral infarction without residual deficits: Secondary | ICD-10-CM | POA: Diagnosis not present

## 2023-06-16 DIAGNOSIS — R7989 Other specified abnormal findings of blood chemistry: Secondary | ICD-10-CM | POA: Diagnosis not present

## 2023-06-16 DIAGNOSIS — I87009 Postthrombotic syndrome without complications of unspecified extremity: Secondary | ICD-10-CM | POA: Diagnosis not present

## 2023-06-16 DIAGNOSIS — I253 Aneurysm of heart: Secondary | ICD-10-CM | POA: Diagnosis not present

## 2023-06-16 DIAGNOSIS — Z6834 Body mass index (BMI) 34.0-34.9, adult: Secondary | ICD-10-CM | POA: Diagnosis not present

## 2023-07-03 DIAGNOSIS — I890 Lymphedema, not elsewhere classified: Secondary | ICD-10-CM | POA: Diagnosis not present

## 2023-07-03 DIAGNOSIS — R600 Localized edema: Secondary | ICD-10-CM | POA: Diagnosis not present
